# Patient Record
Sex: Male | Born: 1975 | Race: White | Hispanic: No | Marital: Married | Smoking: Former smoker
Health system: Southern US, Community
[De-identification: ages and names within clinical notes are randomized; demographics above are authoritative.]

## PROBLEM LIST (undated history)

## (undated) DIAGNOSIS — M1990 Unspecified osteoarthritis, unspecified site: Secondary | ICD-10-CM

## (undated) DIAGNOSIS — J449 Chronic obstructive pulmonary disease, unspecified: Secondary | ICD-10-CM

## (undated) DIAGNOSIS — F419 Anxiety disorder, unspecified: Secondary | ICD-10-CM

## (undated) DIAGNOSIS — Z87442 Personal history of urinary calculi: Secondary | ICD-10-CM

## (undated) DIAGNOSIS — F329 Major depressive disorder, single episode, unspecified: Secondary | ICD-10-CM

## (undated) DIAGNOSIS — F32A Depression, unspecified: Secondary | ICD-10-CM

## (undated) DIAGNOSIS — H9319 Tinnitus, unspecified ear: Secondary | ICD-10-CM

## (undated) DIAGNOSIS — J189 Pneumonia, unspecified organism: Secondary | ICD-10-CM

## (undated) DIAGNOSIS — H9190 Unspecified hearing loss, unspecified ear: Secondary | ICD-10-CM

## (undated) DIAGNOSIS — Z8709 Personal history of other diseases of the respiratory system: Secondary | ICD-10-CM

## (undated) DIAGNOSIS — I517 Cardiomegaly: Secondary | ICD-10-CM

---

## 2001-04-22 ENCOUNTER — Encounter: Payer: Self-pay | Admitting: Emergency Medicine

## 2001-04-22 ENCOUNTER — Emergency Department (HOSPITAL_COMMUNITY): Admission: EM | Admit: 2001-04-22 | Discharge: 2001-04-22 | Payer: Self-pay | Admitting: Emergency Medicine

## 2002-04-24 ENCOUNTER — Emergency Department (HOSPITAL_COMMUNITY): Admission: EM | Admit: 2002-04-24 | Discharge: 2002-04-24 | Payer: Self-pay | Admitting: Emergency Medicine

## 2008-01-15 ENCOUNTER — Emergency Department (HOSPITAL_COMMUNITY): Admission: EM | Admit: 2008-01-15 | Discharge: 2008-01-15 | Payer: Self-pay | Admitting: Emergency Medicine

## 2008-04-08 ENCOUNTER — Emergency Department (HOSPITAL_COMMUNITY): Admission: EM | Admit: 2008-04-08 | Discharge: 2008-04-08 | Payer: Self-pay | Admitting: Emergency Medicine

## 2008-04-30 ENCOUNTER — Emergency Department (HOSPITAL_COMMUNITY): Admission: EM | Admit: 2008-04-30 | Discharge: 2008-04-30 | Payer: Self-pay | Admitting: Emergency Medicine

## 2008-05-14 ENCOUNTER — Ambulatory Visit (HOSPITAL_COMMUNITY): Admission: RE | Admit: 2008-05-14 | Discharge: 2008-05-14 | Payer: Self-pay | Admitting: Orthopaedic Surgery

## 2008-08-07 ENCOUNTER — Emergency Department (HOSPITAL_COMMUNITY): Admission: EM | Admit: 2008-08-07 | Discharge: 2008-08-07 | Payer: Self-pay | Admitting: Emergency Medicine

## 2009-01-07 ENCOUNTER — Emergency Department (HOSPITAL_COMMUNITY): Admission: EM | Admit: 2009-01-07 | Discharge: 2009-01-08 | Payer: Self-pay | Admitting: Emergency Medicine

## 2009-04-02 ENCOUNTER — Emergency Department (HOSPITAL_COMMUNITY): Admission: EM | Admit: 2009-04-02 | Discharge: 2009-04-02 | Payer: Self-pay | Admitting: Emergency Medicine

## 2009-06-23 ENCOUNTER — Emergency Department (HOSPITAL_COMMUNITY): Admission: EM | Admit: 2009-06-23 | Discharge: 2009-06-23 | Payer: Self-pay | Admitting: Emergency Medicine

## 2009-12-29 ENCOUNTER — Emergency Department (HOSPITAL_COMMUNITY): Admission: EM | Admit: 2009-12-29 | Discharge: 2009-12-29 | Payer: Self-pay | Admitting: Emergency Medicine

## 2010-08-04 ENCOUNTER — Emergency Department (HOSPITAL_COMMUNITY): Admission: EM | Admit: 2010-08-04 | Discharge: 2010-08-04 | Payer: Self-pay | Admitting: Emergency Medicine

## 2011-02-28 LAB — BASIC METABOLIC PANEL
Calcium: 9 mg/dL (ref 8.4–10.5)
Creatinine, Ser: 1.09 mg/dL (ref 0.4–1.5)
GFR calc Af Amer: >60 mL/min (ref >60)
GFR calc non Af Amer: >60 mL/min (ref >60)

## 2011-02-28 LAB — DIFFERENTIAL
Basophils Relative: 0 % (ref 0–1)
Lymphocytes Relative: 6 % — ABNORMAL LOW (ref 12–46)
Lymphs Abs: 0.5 10*3/uL — ABNORMAL LOW (ref 0.7–4.0)
Monocytes Relative: 4 % (ref 3–12)
Neutro Abs: 8.1 10*3/uL — ABNORMAL HIGH (ref 1.7–7.7)
Neutrophils Relative %: 90 % — ABNORMAL HIGH (ref 43–77)

## 2011-02-28 LAB — CBC
RBC: 5.24 MIL/uL (ref 4.22–5.81)
WBC: 8.9 10*3/uL (ref 4.0–10.5)

## 2011-10-15 ENCOUNTER — Emergency Department (HOSPITAL_COMMUNITY)
Admission: EM | Admit: 2011-10-15 | Discharge: 2011-10-16 | Disposition: A | Discharge status: Home / Self Care | Payer: Self-pay | Attending: Emergency Medicine | Admitting: Emergency Medicine

## 2011-10-15 DIAGNOSIS — M545 Low back pain, unspecified: Secondary | ICD-10-CM | POA: Insufficient documentation

## 2011-10-15 DIAGNOSIS — IMO0001 Reserved for inherently not codable concepts without codable children: Secondary | ICD-10-CM | POA: Insufficient documentation

## 2011-10-15 DIAGNOSIS — J438 Other emphysema: Secondary | ICD-10-CM | POA: Insufficient documentation

## 2011-10-15 DIAGNOSIS — M5430 Sciatica, unspecified side: Secondary | ICD-10-CM | POA: Insufficient documentation

## 2012-01-21 DIAGNOSIS — J189 Pneumonia, unspecified organism: Secondary | ICD-10-CM

## 2012-01-21 HISTORY — DX: Pneumonia, unspecified organism: J18.9

## 2012-03-10 ENCOUNTER — Emergency Department (INDEPENDENT_AMBULATORY_CARE_PROVIDER_SITE_OTHER): Payer: Self-pay

## 2012-03-10 ENCOUNTER — Emergency Department (INDEPENDENT_AMBULATORY_CARE_PROVIDER_SITE_OTHER)
Admission: EM | Admit: 2012-03-10 | Discharge: 2012-03-10 | Disposition: A | Discharge status: Home Health | Payer: Self-pay | Source: Home / Self Care | Attending: Emergency Medicine | Admitting: Emergency Medicine

## 2012-03-10 ENCOUNTER — Encounter (HOSPITAL_COMMUNITY): Payer: Self-pay

## 2012-03-10 DIAGNOSIS — S43429A Sprain of unspecified rotator cuff capsule, initial encounter: Secondary | ICD-10-CM

## 2012-03-10 HISTORY — DX: Major depressive disorder, single episode, unspecified: F32.9

## 2012-03-10 HISTORY — DX: Depression, unspecified: F32.A

## 2012-03-10 HISTORY — DX: Chronic obstructive pulmonary disease, unspecified: J44.9

## 2012-03-10 MED ORDER — ACETAMINOPHEN-CODEINE #3 300-30 MG PO TABS: 1.0000 | ORAL_TABLET | ORAL | Status: AC | PRN

## 2012-03-10 MED ORDER — DIPHENOXYLATE-ATROPINE 2.5-0.025 MG PO TABS: 1.0000 | ORAL_TABLET | Freq: Four times a day (QID) | ORAL | Status: DC | PRN

## 2012-03-10 MED ORDER — DICLOFENAC SODIUM 75 MG PO TBEC: 75.0000 mg | DELAYED_RELEASE_TABLET | Freq: Two times a day (BID) | ORAL | Status: DC

## 2012-03-10 NOTE — ED Notes (Signed)
Pt c/o L shoulder pain onset last night around 930pm.  Pt states he was reaching and pulling object that weighs approx 20lbs when he heard a pop.  Pain onset 10-15 mins later.  Pt has not taken any meds PTA for discomfort.  Pt states pain increases with movement.

## 2012-03-10 NOTE — ED Provider Notes (Signed)
Chief Complaint  Patient presents with  . Shoulder Pain    History of Present Illness:   Martin Bridges is a 36 year old male who injured his left shoulder today while at work. He was reaching for a heavy timber on his truck, when it he heard a popping sound in the shoulder, and ever since then he's had pain in the left shoulder on down to the tips of the fingers. It hurts with any movement of his shoulder. He denies any numbness, tingling, or weakness.  Review of Systems:  Other than noted above, the patient denies any of the following symptoms: Systemic:  No fevers, chills, sweats, or aches.  No fatigue or tiredness. Musculoskeletal:  No joint pain, arthritis, bursitis, swelling, back pain, or neck pain. Neurological:  No muscular weakness, paresthesias, headache, or trouble with speech or coordination.  No dizziness.   PMFSH:  Past medical history, family history, social history, meds, and allergies were reviewed.  Physical Exam:   Vital signs:  BP 129/81  Pulse 91  Temp(Src) 97 F (36.1 C) (Oral)  Resp 12  Ht 6\' 3"  (1.905 m)  Wt 340 lb (154.223 kg)  BMI 42.50 kg/m2  SpO2 99% Gen:  Alert and oriented times 3.  In no distress. Musculoskeletal: There was a little pain to palpation over the a.c. joint. No swelling, bruising, or deformity. He has about 60 of abduction actively with pain in the same amount passively. There is no pain with internal or external rotation. He does have pain with flexion, and he can flex to about 60 as well both actively and passively. Otherwise, all joints had a full a ROM with no swelling, bruising or deformity.  No edema, pulses full. Extremities were warm and pink.  Capillary refill was brisk.  Skin:  Clear, warm and dry.  No rash. Neuro:  Alert and oriented times 3.  Muscle strength was normal.  Sensation was intact to light touch.   Radiology:  Dg Shoulder Left  03/10/2012  *RADIOLOGY REPORT*  Clinical Data: Left shoulder injury last night  LEFT SHOULDER -  2+ VIEW  Comparison: None.  Findings: Three views of the left shoulder submitted.  No acute fracture or subluxation.  IMPRESSION: No acute fracture or subluxation.  Original Report Authenticated By: Natasha Mead, M.D.    Assessment:   Diagnoses that have been ruled out:  None  Diagnoses that are still under consideration:  None  Final diagnoses:  Rotator cuff sprain    Plan:   1.  The following meds were prescribed:   New Prescriptions   ACETAMINOPHEN-CODEINE (TYLENOL #3) 300-30 MG PER TABLET    Take 1-2 tablets by mouth every 4 (four) hours as needed for pain.   DICLOFENAC (VOLTAREN) 75 MG EC TABLET    Take 1 tablet (75 mg total) by mouth 2 (two) times daily.   2.  The patient was instructed in symptomatic care, including rest and activity, elevation, application of ice and compression.  Appropriate handouts were given. 3.  The patient was told to return if becoming worse in any way, if no better in 3 or 4 days, and given some red flag symptoms that would indicate earlier return.   4.  The patient was told to follow up with occupational health on Monday. In the meantime he was put in a sling and instructed in pendulum exercises. He should apply ice every 2-3 hours.   Reuben Likes, MD 03/10/12 2132

## 2012-03-10 NOTE — Discharge Instructions (Signed)
Rotator Cuff Injury  The rotator cuff is the collective set of muscles and tendons that make up the stabilizing unit of your shoulder. This unit holds in the ball of the humerus (upper arm bone) in the socket of the scapula (shoulder blade). Injuries to this stabilizing unit most commonly come from sports or activities that cause the arm to be moved repeatedly over the head. Examples of this include throwing, weight lifting, swimming, racquet sports, or an injury such as falling on your arm. Chronic (longstanding) irritation of this unit can cause inflammation (soreness), bursitis, and eventual damage to the tendons to the point of rupture (tear). An acute (sudden) injury of the rotator cuff can result in a partial or complete tear. You may need surgery with complete tears. Small or partial rotator cuff tears may be treated conservatively with temporary immobilization, exercises and rest. Physical therapy may be needed.  HOME CARE INSTRUCTIONS    Apply ice to the injury for 15 to 20 minutes 3 to 4 times per day for the first 2 days. Put the ice in a plastic bag and place a towel between the bag of ice and your skin.   If you have a shoulder immobilizer (sling and straps), do not remove it for as long as directed by your caregiver or until you see a caregiver for a follow-up examination. If you need to remove it, move your arm as little as possible.   You may want to sleep on several pillows or in a recliner at night to lessen swelling and pain.   Only take over-the-counter or prescription medicines for pain, discomfort, or fever as directed by your caregiver.   Do simple hand squeezing exercises with a soft rubber ball to decrease hand swelling.  SEEK MEDICAL CARE IF:    Pain in your shoulder increases or new pain or numbness develops in your arm, hand, or fingers.   Your hand or fingers are colder than your other hand.  SEEK IMMEDIATE MEDICAL CARE IF:    Your arm, hand, or fingers are numb or  tingling.   Your arm, hand, or fingers are increasingly swollen and painful, or turn white or blue.  Document Released: 11/26/2000 Document Revised: 11/18/2011 Document Reviewed: 11/19/2008  [employer] Patient Information 2012 [employer].

## 2012-05-24 ENCOUNTER — Encounter (HOSPITAL_COMMUNITY)
Admission: RE | Admit: 2012-05-24 | Discharge: 2012-05-24 | Disposition: A | Discharge status: Home / Self Care | Payer: BC Managed Care – PPO | Source: Ambulatory Visit | Attending: Orthopedic Surgery | Admitting: Orthopedic Surgery

## 2012-05-24 ENCOUNTER — Encounter (HOSPITAL_COMMUNITY): Payer: Self-pay

## 2012-05-24 ENCOUNTER — Encounter (HOSPITAL_COMMUNITY): Payer: Self-pay | Admitting: Pharmacy Technician

## 2012-05-24 LAB — COMPREHENSIVE METABOLIC PANEL
ALT: 23 U/L (ref 0–53)
AST: 22 U/L (ref 0–37)
Albumin: 3.8 g/dL (ref 3.5–5.2)
Alkaline Phosphatase: 82 U/L (ref 39–117)
BUN: 11 mg/dL (ref 6–23)
CO2: 30 mEq/L (ref 19–32)
Calcium: 9.8 mg/dL (ref 8.4–10.5)
Chloride: 100 mEq/L (ref 96–112)
Creatinine, Ser: 0.99 mg/dL (ref 0.50–1.35)
GFR calc Af Amer: >90 mL/min (ref >90)
GFR calc non Af Amer: >90 mL/min (ref >90)
Glucose, Bld: 136 mg/dL — ABNORMAL HIGH (ref 70–99)
Potassium: 3.4 mEq/L — ABNORMAL LOW (ref 3.5–5.1)
Sodium: 139 mEq/L (ref 135–145)
Total Bilirubin: 0.3 mg/dL (ref 0.3–1.2)
Total Protein: 7.7 g/dL (ref 6.0–8.3)

## 2012-05-24 LAB — DIFFERENTIAL
Basophils Absolute: 0 10*3/uL (ref 0.0–0.1)
Basophils Relative: 0 % (ref 0–1)
Eosinophils Absolute: 0.1 10*3/uL (ref 0.0–0.7)
Eosinophils Relative: 1 % (ref 0–5)
Lymphocytes Relative: 25 % (ref 12–46)
Lymphs Abs: 2.2 10*3/uL (ref 0.7–4.0)
Monocytes Absolute: 0.4 10*3/uL (ref 0.1–1.0)
Monocytes Relative: 4 % (ref 3–12)
Neutro Abs: 6.1 10*3/uL (ref 1.7–7.7)
Neutrophils Relative %: 69 % (ref 43–77)

## 2012-05-24 LAB — CBC
HCT: 44.1 % (ref 39.0–52.0)
Hemoglobin: 14.6 g/dL (ref 13.0–17.0)
MCH: 27.8 pg (ref 26.0–34.0)
MCHC: 33.1 g/dL (ref 30.0–36.0)
MCV: 84 fL (ref 78.0–100.0)
Platelets: 294 10*3/uL (ref 150–400)
RBC: 5.25 MIL/uL (ref 4.22–5.81)
RDW: 13.3 % (ref 11.5–15.5)
WBC: 8.8 10*3/uL (ref 4.0–10.5)

## 2012-05-24 LAB — URINALYSIS, ROUTINE W REFLEX MICROSCOPIC
Bilirubin Urine: NEGATIVE
Glucose, UA: NEGATIVE mg/dL
Hgb urine dipstick: NEGATIVE
Ketones, ur: NEGATIVE mg/dL
Leukocytes, UA: NEGATIVE
Nitrite: NEGATIVE
Protein, ur: NEGATIVE mg/dL
Specific Gravity, Urine: 1.023 (ref 1.005–1.030)
Urobilinogen, UA: 1 mg/dL (ref 0.0–1.0)
pH: 6.5 (ref 5.0–8.0)

## 2012-05-24 LAB — APTT: aPTT: 34 seconds (ref 24–37)

## 2012-05-24 LAB — SURGICAL PCR SCREEN: MRSA, PCR: NEGATIVE

## 2012-05-24 LAB — PROTIME-INR
INR: 0.93 (ref 0.00–1.49)
Prothrombin Time: 12.7 seconds (ref 11.6–15.2)

## 2012-05-24 MED ORDER — CEFAZOLIN SODIUM 1-5 GM-% IV SOLN: 1.0000 g | INTRAVENOUS | Status: DC

## 2012-05-24 NOTE — Progress Notes (Signed)
05/24/12 1111  OBSTRUCTIVE SLEEP APNEA  Have you ever been diagnosed with sleep apnea through a sleep study? No  Do you snore loudly (loud enough to be heard through closed doors)?  1  Do you often feel tired, fatigued, or sleepy during the daytime? 0  Has anyone observed you stop breathing during your sleep? 0  Do you have, or are you being treated for high blood pressure? 0  BMI more than 35 kg/m2? 1  Age over 34 years old? 0  Neck circumference greater than 40 cm/18 inches? 1  Gender: 1  Obstructive Sleep Apnea Score 4   Score 4 or greater  Updated health history;Results sent to PCP

## 2012-05-24 NOTE — Pre-Procedure Instructions (Signed)
barimax bed with trapeze ordered with [facility]. Chest 2 view xray [facility]  05-09-2012 on chart ekg 05-02-2012 [facility] on chart Medical clearance note dr Corrie Dandy phillips on chart

## 2012-05-24 NOTE — Pre-Procedure Instructions (Signed)
stopbang assessment faxed to dr badger [facility], fax confirmation received and placed on pt chart

## 2012-05-24 NOTE — Patient Instructions (Addendum)
22 Martin Bridges  05/24/2012   Your procedure is scheduled on:  05-31-2012  Report to [facility] at 0630  AM.  Call this number if you have problems the morning of surgery: [phone]   Remember:   Do not eat food or drink liquids:After Midnight.  .  Take these medicines the morning of surgery with A SIP OF WATER: albuterol nebulizer ,  Albuterol inhaler if needed and bring inhaler, zoloft.   Do not wear jewelry or make up.  Do not wear lotions, powders, or perfumes.Do not wear deodorant.    Do not bring valuables to the hospital.  Contacts, dentures or bridgework may not be worn into surgery.  Leave suitcase in the car. After surgery it may be brought to your room.  For patients admitted to the hospital, checkout time is 11:00 AM the day of  discharge.     Special Instructions: CHG Shower Use Special Wash: 1/2 bottle night before surgery and 1/2 bottle morning of surgery, use regular soap on face and front and back private area.   Please read over the following fact sheets that you were given: MRSA Information, incentive spirometer fact sheet  [name] pre op nurse phone number [phone], call if needed

## 2012-05-29 NOTE — H&P (Signed)
Martin Bridges DOB: [birth year]  Chief Complaint: left shoulder pain  History of Present Illness The patient is a 36 year old male who is scheduled for an open left rotator cuff repair with Dr. Darrelyn Hillock on Wednesday May 31, 2012. On 03-09-2012 he was lifting a very heavy timber, about 30-40 lbs. 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 Appeared clinically that he tore his rotator cuff. He had no previous injury to that shoulder, no previous problems, and he was actively working with it every day so we have to say that this injury occurred on the job at that time.MRI of his left shoulder showed that he has a definite tear, especially at the junction of the supraspinatus and infraspinatus.    Problem List/Past MedicalHistory Sprain/strain, rotator cuff, traumatic (840.4) Chronic Obstructive Lung Disease Emphysema Of Lung Asthma   Allergies Metal. "all kinds" No Known Drug Allergies.    Family History Congestive Heart Failure. grandmother fathers side Depression. mother Heart Disease. grandmother fathers side Chronic Obstructive Lung Disease. grandmother mothers side and grandfather fathers side Osteoarthritis. grandmother fathers side Rheumatoid Arthritis. grandmother fathers side   Social History Drug/Alcohol Rehab (Previously). no Exercise. Exercises rarely; does other Illicit drug use. no Children. 4 Current work status. working full time Drug/Alcohol Rehab (Currently). no Living situation. live with spouse Tobacco / smoke exposure. yes Tobacco use. former smoker; uses 2 or more can(s) smokeless per week Marital status. married Number of flights of stairs before winded. 4-5 Pain Contract. no Alcohol use. current drinker; drinks beer and hard liquor; only occasionally per week   Past Surgical History No pertinent past surgical  history    Review of Systems General:Present- Fatigue. Not Present- Chills, Fever, Night Sweats, Appetite Loss, Feeling sick, Weight Gain and Weight Loss. Skin:Not Present- Itching, Rash, Skin Color Changes, Ulcer, Psoriasis and Change in Hair or Nails. HEENT:Present- Ringing in the Ears. Not Present- Sensitivity to light, Hearing problems and Nose Bleed. Neck:Not Present- Swollen Glands and Neck Mass. Respiratory:Present- Snoring and Dyspnea. Not Present- Chronic Cough and Bloody sputum. Cardiovascular:Not Present- Shortness of Breath, Chest Pain, Swelling of Extremities, Leg Cramps and Palpitations. Gastrointestinal:Not Present- Bloody Stool, Heartburn, Abdominal Pain, Vomiting, Nausea and Incontinence of Stool. Male Genitourinary:Not Present- Blood in Urine, Frequency, Incontinence and Nocturia. Musculoskeletal:Present- Muscle Weakness, Muscle Pain, Joint Stiffness and Joint Pain. Not Present- Joint Swelling and Back Pain. Neurological:Not Present- Tingling, Numbness, Burning, Tremor, Headaches and Dizziness. Psychiatric:Present- Depression. Not Present- Anxiety and Memory Loss. Endocrine:Not Present- Cold Intolerance, Heat Intolerance, Excessive hunger and Excessive Thirst. Hematology:Not Present- Abnormal Bleeding, Anemia, Blood Clots and Easy Bruising.  Vitals Weight: 357 lb Height: 75 in Body Surface Area: 2.93 m Body Mass Index: 44.62 kg/m Pulse: 95 (Regular) BP: 149/81 (Sitting, Left Arm, Standard)    Physical Exam Physical exam of his shoulder is painful and limited in all ranges. No masses or tumors about the shoulder girdle, per se, or the axilla. AC joint is intact. His shoulder is well located, but he is extremely weak for a strong guy in regards to abduction or any motion of the shoulder. There is quite a bit of discomfort anteriorly as well. Oral cavity is fine except he does have a tongue ring in that he will have to remove prior to  surgery.  Neck: Neck supple, no bruits Lungs:He has some decreased breath sounds. She has a history of asthma and COPD. Heart:Normal  sinus rhythm, no murmurs.      Assessment & Plan Partial rotator cuff rupture, non-traumatic (726.13) Open left shoulder rotator cuff repair    Dimitri Ped, PA-C

## 2012-05-31 ENCOUNTER — Ambulatory Visit (HOSPITAL_COMMUNITY)
Admission: RE | Admit: 2012-05-31 | Discharge: 2012-06-01 | Disposition: A | Discharge status: Home / Self Care | Payer: BC Managed Care – PPO | Source: Ambulatory Visit | Attending: Orthopedic Surgery | Admitting: Orthopedic Surgery

## 2012-05-31 ENCOUNTER — Encounter (HOSPITAL_COMMUNITY): Payer: Self-pay | Admitting: *Deleted

## 2012-05-31 ENCOUNTER — Ambulatory Visit (HOSPITAL_COMMUNITY): Payer: BC Managed Care – PPO | Admitting: Anesthesiology

## 2012-05-31 ENCOUNTER — Encounter (HOSPITAL_COMMUNITY)
Admission: RE | Disposition: A | Discharge status: Home / Self Care | Payer: Self-pay | Source: Ambulatory Visit | Attending: Orthopedic Surgery

## 2012-05-31 ENCOUNTER — Encounter (HOSPITAL_COMMUNITY): Payer: Self-pay | Admitting: Anesthesiology

## 2012-05-31 DIAGNOSIS — X500XXA Overexertion from strenuous movement or load, initial encounter: Secondary | ICD-10-CM | POA: Insufficient documentation

## 2012-05-31 DIAGNOSIS — M25819 Other specified joint disorders, unspecified shoulder: Secondary | ICD-10-CM | POA: Insufficient documentation

## 2012-05-31 DIAGNOSIS — M75100 Unspecified rotator cuff tear or rupture of unspecified shoulder, not specified as traumatic: Secondary | ICD-10-CM

## 2012-05-31 DIAGNOSIS — S43429A Sprain of unspecified rotator cuff capsule, initial encounter: Secondary | ICD-10-CM | POA: Insufficient documentation

## 2012-05-31 DIAGNOSIS — Y929 Unspecified place or not applicable: Secondary | ICD-10-CM | POA: Insufficient documentation

## 2012-05-31 DIAGNOSIS — J4489 Other specified chronic obstructive pulmonary disease: Secondary | ICD-10-CM | POA: Insufficient documentation

## 2012-05-31 DIAGNOSIS — Z01812 Encounter for preprocedural laboratory examination: Secondary | ICD-10-CM | POA: Insufficient documentation

## 2012-05-31 DIAGNOSIS — J449 Chronic obstructive pulmonary disease, unspecified: Secondary | ICD-10-CM | POA: Insufficient documentation

## 2012-05-31 DIAGNOSIS — M12819 Other specific arthropathies, not elsewhere classified, unspecified shoulder: Secondary | ICD-10-CM | POA: Diagnosis present

## 2012-05-31 DIAGNOSIS — Y9389 Activity, other specified: Secondary | ICD-10-CM | POA: Insufficient documentation

## 2012-05-31 HISTORY — PX: SHOULDER OPEN ROTATOR CUFF REPAIR: SHX2407

## 2012-05-31 SURGERY — REPAIR, ROTATOR CUFF, OPEN
Anesthesia: General | Site: Shoulder | Laterality: Left | Wound class: Clean

## 2012-05-31 MED ORDER — LACTATED RINGERS IV SOLN
INTRAVENOUS | Status: DC | PRN
  Administered 2012-05-31: 08:00:00 via INTRAVENOUS

## 2012-05-31 MED ORDER — ONDANSETRON HCL 4 MG/2ML IJ SOLN: 4.0000 mg | Freq: Four times a day (QID) | INTRAMUSCULAR | Status: DC | PRN

## 2012-05-31 MED ORDER — FENTANYL CITRATE 0.05 MG/ML IJ SOLN
INTRAMUSCULAR | Status: DC | PRN
  Administered 2012-05-31 (×3): 50 ug via INTRAVENOUS
  Administered 2012-05-31: 100 ug via INTRAVENOUS

## 2012-05-31 MED ORDER — ACETAMINOPHEN 10 MG/ML IV SOLN
INTRAVENOUS | Status: AC
  Filled 2012-05-31: qty 100

## 2012-05-31 MED ORDER — METOCLOPRAMIDE HCL 5 MG/ML IJ SOLN: 5.0000 mg | Freq: Three times a day (TID) | INTRAMUSCULAR | Status: DC | PRN

## 2012-05-31 MED ORDER — OXYCODONE-ACETAMINOPHEN 5-325 MG PO TABS
1.0000 | ORAL_TABLET | ORAL | Status: DC | PRN
  Administered 2012-05-31 – 2012-06-01 (×3): 2 via ORAL
  Filled 2012-05-31 (×3): qty 2

## 2012-05-31 MED ORDER — BISACODYL 10 MG RE SUPP: 10.0000 mg | Freq: Every day | RECTAL | Status: DC | PRN

## 2012-05-31 MED ORDER — SUCCINYLCHOLINE CHLORIDE 20 MG/ML IJ SOLN
INTRAMUSCULAR | Status: DC | PRN
  Administered 2012-05-31: 100 mg via INTRAVENOUS

## 2012-05-31 MED ORDER — ACETAMINOPHEN 650 MG RE SUPP: 650.0000 mg | Freq: Four times a day (QID) | RECTAL | Status: DC | PRN

## 2012-05-31 MED ORDER — 0.9 % SODIUM CHLORIDE (POUR BTL) OPTIME
TOPICAL | Status: DC | PRN
  Administered 2012-05-31: 1000 mL

## 2012-05-31 MED ORDER — ALBUTEROL SULFATE (5 MG/ML) 0.5% IN NEBU: 2.5000 mg | INHALATION_SOLUTION | RESPIRATORY_TRACT | Status: DC | PRN

## 2012-05-31 MED ORDER — BUPIVACAINE LIPOSOME 1.3 % IJ SUSP
20.0000 mL | INTRAMUSCULAR | Status: AC
  Administered 2012-05-31: 20 mL
  Filled 2012-05-31: qty 20

## 2012-05-31 MED ORDER — MIDAZOLAM HCL 2 MG/2ML IJ SOLN
INTRAMUSCULAR | Status: DC | PRN
  Administered 2012-05-31: 2 mg via INTRAVENOUS

## 2012-05-31 MED ORDER — LIDOCAINE HCL (CARDIAC) 20 MG/ML IV SOLN
INTRAVENOUS | Status: DC | PRN
  Administered 2012-05-31: 80 mg via INTRAVENOUS

## 2012-05-31 MED ORDER — HYDROMORPHONE HCL PF 1 MG/ML IJ SOLN
INTRAMUSCULAR | Status: AC
  Filled 2012-05-31: qty 1

## 2012-05-31 MED ORDER — CEFAZOLIN SODIUM-DEXTROSE 2-3 GM-% IV SOLR
2.0000 g | Freq: Once | INTRAVENOUS | Status: AC
  Administered 2012-05-31: 2 g via INTRAVENOUS

## 2012-05-31 MED ORDER — POLYETHYLENE GLYCOL 3350 17 G PO PACK: 17.0000 g | PACK | Freq: Every day | ORAL | Status: DC | PRN

## 2012-05-31 MED ORDER — METHOCARBAMOL 500 MG PO TABS
500.0000 mg | ORAL_TABLET | Freq: Four times a day (QID) | ORAL | Status: DC | PRN
  Administered 2012-05-31 – 2012-06-01 (×2): 500 mg via ORAL
  Filled 2012-05-31 (×2): qty 1

## 2012-05-31 MED ORDER — THROMBIN 5000 UNITS EX SOLR
CUTANEOUS | Status: AC
  Filled 2012-05-31: qty 5000

## 2012-05-31 MED ORDER — MENTHOL 3 MG MT LOZG: 1.0000 | LOZENGE | OROMUCOSAL | Status: DC | PRN

## 2012-05-31 MED ORDER — LACTATED RINGERS IV SOLN
INTRAVENOUS | Status: DC
  Administered 2012-05-31 (×2): via INTRAVENOUS

## 2012-05-31 MED ORDER — SERTRALINE HCL 100 MG PO TABS
100.0000 mg | ORAL_TABLET | Freq: Every morning | ORAL | Status: DC
  Administered 2012-05-31 – 2012-06-01 (×2): 100 mg via ORAL
  Filled 2012-05-31 (×2): qty 1

## 2012-05-31 MED ORDER — ALBUTEROL SULFATE HFA 108 (90 BASE) MCG/ACT IN AERS
INHALATION_SPRAY | RESPIRATORY_TRACT | Status: AC
  Filled 2012-05-31: qty 6.7

## 2012-05-31 MED ORDER — HYDROCODONE-ACETAMINOPHEN 5-325 MG PO TABS
1.0000 | ORAL_TABLET | ORAL | Status: DC | PRN
  Administered 2012-06-01: 2 via ORAL
  Filled 2012-05-31: qty 2

## 2012-05-31 MED ORDER — ACETAMINOPHEN 10 MG/ML IV SOLN
INTRAVENOUS | Status: DC | PRN
  Administered 2012-05-31: 1000 mg via INTRAVENOUS

## 2012-05-31 MED ORDER — HYDROMORPHONE HCL PF 1 MG/ML IJ SOLN
0.2500 mg | INTRAMUSCULAR | Status: DC | PRN
  Administered 2012-05-31 (×4): 0.5 mg via INTRAVENOUS

## 2012-05-31 MED ORDER — ONDANSETRON HCL 4 MG PO TABS: 4.0000 mg | ORAL_TABLET | Freq: Four times a day (QID) | ORAL | Status: DC | PRN

## 2012-05-31 MED ORDER — ROCURONIUM BROMIDE 100 MG/10ML IV SOLN
INTRAVENOUS | Status: DC | PRN
  Administered 2012-05-31: 30 mg via INTRAVENOUS
  Administered 2012-05-31: 20 mg via INTRAVENOUS

## 2012-05-31 MED ORDER — METHOCARBAMOL 100 MG/ML IJ SOLN
500.0000 mg | Freq: Four times a day (QID) | INTRAVENOUS | Status: DC | PRN
  Administered 2012-05-31: 500 mg via INTRAVENOUS
  Filled 2012-05-31 (×2): qty 5

## 2012-05-31 MED ORDER — ACETAMINOPHEN 325 MG PO TABS: 650.0000 mg | ORAL_TABLET | Freq: Four times a day (QID) | ORAL | Status: DC | PRN

## 2012-05-31 MED ORDER — CEFAZOLIN SODIUM 1-5 GM-% IV SOLN
1.0000 g | Freq: Four times a day (QID) | INTRAVENOUS | Status: AC
  Administered 2012-05-31 – 2012-06-01 (×3): 1 g via INTRAVENOUS
  Filled 2012-05-31 (×4): qty 50

## 2012-05-31 MED ORDER — LACTATED RINGERS IV SOLN: INTRAVENOUS | Status: DC

## 2012-05-31 MED ORDER — SODIUM CHLORIDE 0.9 % IR SOLN
Status: DC | PRN
  Administered 2012-05-31: 09:00:00

## 2012-05-31 MED ORDER — FLEET ENEMA 7-19 GM/118ML RE ENEM: 1.0000 | ENEMA | Freq: Once | RECTAL | Status: AC | PRN

## 2012-05-31 MED ORDER — ONDANSETRON HCL 4 MG/2ML IJ SOLN
INTRAMUSCULAR | Status: DC | PRN
  Administered 2012-05-31: 4 mg via INTRAVENOUS

## 2012-05-31 MED ORDER — CEFAZOLIN SODIUM-DEXTROSE 2-3 GM-% IV SOLR
INTRAVENOUS | Status: AC
  Filled 2012-05-31: qty 50

## 2012-05-31 MED ORDER — DEXAMETHASONE SODIUM PHOSPHATE 10 MG/ML IJ SOLN
INTRAMUSCULAR | Status: DC | PRN
  Administered 2012-05-31: 10 mg via INTRAVENOUS

## 2012-05-31 MED ORDER — PHENOL 1.4 % MT LIQD: 1.0000 | OROMUCOSAL | Status: DC | PRN

## 2012-05-31 MED ORDER — HYDROMORPHONE HCL PF 1 MG/ML IJ SOLN
0.5000 mg | INTRAMUSCULAR | Status: DC | PRN
  Administered 2012-05-31 (×2): 1 mg via INTRAVENOUS
  Filled 2012-05-31: qty 1

## 2012-05-31 MED ORDER — PROPOFOL 10 MG/ML IV EMUL
INTRAVENOUS | Status: DC | PRN
  Administered 2012-05-31: 200 mg via INTRAVENOUS

## 2012-05-31 MED ORDER — ALBUTEROL SULFATE HFA 108 (90 BASE) MCG/ACT IN AERS
2.0000 | INHALATION_SPRAY | Freq: Four times a day (QID) | RESPIRATORY_TRACT | Status: DC | PRN
  Filled 2012-05-31: qty 6.7

## 2012-05-31 MED ORDER — METOCLOPRAMIDE HCL 10 MG PO TABS: 5.0000 mg | ORAL_TABLET | Freq: Three times a day (TID) | ORAL | Status: DC | PRN

## 2012-05-31 MED ORDER — BACITRACIN-NEOMYCIN-POLYMYXIN 400-5-5000 EX OINT
TOPICAL_OINTMENT | CUTANEOUS | Status: AC
  Filled 2012-05-31: qty 1

## 2012-05-31 SURGICAL SUPPLY — 51 items
BAG SPEC THK2 15X12 ZIP CLS (MISCELLANEOUS) ×1
BAG ZIPLOCK 12X15 (MISCELLANEOUS) ×2 IMPLANT
BLADE OSCILLATING/SAGITTAL (BLADE) ×2
BLADE SW THK.38XMED LNG THN (BLADE) ×1 IMPLANT
BNDG COHESIVE 6X5 TAN NS LF (GAUZE/BANDAGES/DRESSINGS) IMPLANT
BUR OVAL CARBIDE 4.0 (BURR) ×2 IMPLANT
CLEANER TIP ELECTROSURG 2X2 (MISCELLANEOUS) ×2 IMPLANT
CLOTH BEACON ORANGE TIMEOUT ST (SAFETY) ×2 IMPLANT
DRAPE INCISE IOBAN 66X45 STRL (DRAPES) ×1 IMPLANT
DRAPE POUCH INSTRU U-SHP 10X18 (DRAPES) ×2 IMPLANT
DRSG EMULSION OIL 3X3 NADH (GAUZE/BANDAGES/DRESSINGS) ×2 IMPLANT
DRSG PAD ABDOMINAL 8X10 ST (GAUZE/BANDAGES/DRESSINGS) ×3 IMPLANT
DURAPREP 26ML APPLICATOR (WOUND CARE) ×2 IMPLANT
ELECT REM PT RETURN 9FT ADLT (ELECTROSURGICAL) ×2
ELECTRODE REM PT RTRN 9FT ADLT (ELECTROSURGICAL) ×1 IMPLANT
FLOSEAL 10ML (HEMOSTASIS) IMPLANT
GLOVE BIOGEL PI IND STRL 8 (GLOVE) ×1 IMPLANT
GLOVE BIOGEL PI IND STRL 8.5 (GLOVE) ×1 IMPLANT
GLOVE BIOGEL PI INDICATOR 8 (GLOVE) ×1
GLOVE BIOGEL PI INDICATOR 8.5 (GLOVE) ×1
GLOVE ECLIPSE 8.0 STRL XLNG CF (GLOVE) ×4 IMPLANT
GLOVE SURG SS PI 6.5 STRL IVOR (GLOVE) ×4 IMPLANT
GOWN PREVENTION PLUS LG XLONG (DISPOSABLE) ×4 IMPLANT
GOWN STRL REIN XL XLG (GOWN DISPOSABLE) ×4 IMPLANT
KIT BASIN OR (CUSTOM PROCEDURE TRAY) ×2 IMPLANT
MANIFOLD NEPTUNE II (INSTRUMENTS) ×2 IMPLANT
NDL MA TROC 1/2 (NEEDLE) IMPLANT
NEEDLE MA TROC 1/2 (NEEDLE) IMPLANT
NS IRRIG 1000ML POUR BTL (IV SOLUTION) IMPLANT
PACK SHOULDER CUSTOM OPM052 (CUSTOM PROCEDURE TRAY) ×2 IMPLANT
PASSER SUT SWANSON 36MM LOOP (INSTRUMENTS) IMPLANT
PATCH TISSUE MEND 3X3CM (Orthopedic Implant) ×1 IMPLANT
POSITIONER SURGICAL ARM (MISCELLANEOUS) ×2 IMPLANT
SLING ARM IMMOBILIZER LRG (SOFTGOODS) ×2 IMPLANT
SPONGE GAUZE 4X4 12PLY (GAUZE/BANDAGES/DRESSINGS) ×1 IMPLANT
SPONGE SURGIFOAM ABS GEL 100 (HEMOSTASIS) IMPLANT
STAPLER VISISTAT 35W (STAPLE) ×2 IMPLANT
STRIP CLOSURE SKIN 1/2X4 (GAUZE/BANDAGES/DRESSINGS) ×3 IMPLANT
SUCTION FRAZIER 12FR DISP (SUCTIONS) ×2 IMPLANT
SUT BONE WAX W31G (SUTURE) ×2 IMPLANT
SUT ETHIBOND NAB CT1 #1 30IN (SUTURE) IMPLANT
SUT MNCRL AB 4-0 PS2 18 (SUTURE) ×2 IMPLANT
SUT VIC AB 0 CT1 27 (SUTURE) ×2
SUT VIC AB 0 CT1 27XBRD ANTBC (SUTURE) ×1 IMPLANT
SUT VIC AB 1 CT1 27 (SUTURE) ×6
SUT VIC AB 1 CT1 27XBRD ANTBC (SUTURE) ×2 IMPLANT
SUT VIC AB 2-0 CT1 27 (SUTURE) ×6
SUT VIC AB 2-0 CT1 27XBRD (SUTURE) IMPLANT
SUT VIC AB 2-0 CT1 TAPERPNT 27 (SUTURE) IMPLANT
TAPE CLOTH SURG 6X10 WHT LF (GAUZE/BANDAGES/DRESSINGS) ×1 IMPLANT
TOWEL OR 17X26 10 PK STRL BLUE (TOWEL DISPOSABLE) ×4 IMPLANT

## 2012-05-31 NOTE — Interval H&P Note (Signed)
History and Physical Interval Note:  05/31/2012 8:12 AM  Martin Bridges  has presented today for surgery, with the diagnosis of left shoulder rotator cuff tear  The various methods of treatment have been discussed with the patient and family. After consideration of risks, benefits and other options for treatment, the patient has consented to  Procedure(s) (LRB): ROTATOR CUFF REPAIR SHOULDER OPEN (Left) as a surgical intervention .  The patient's history has been reviewed, patient examined, no change in status, stable for surgery.  I have reviewed the patients' chart and labs.  Questions were answered to the patient's satisfaction.     Iris Tatsch A

## 2012-05-31 NOTE — Anesthesia Preprocedure Evaluation (Signed)
Anesthesia Evaluation  Patient identified by MRN, date of birth, ID band Patient awake    Reviewed: Allergy & Precautions, H&P , NPO status , Patient's Chart, lab work & pertinent test results, reviewed documented beta blocker date and time   Airway Mallampati: II TM Distance: >3 FB   Mouth opening: Limited Mouth Opening  Dental  (+) Teeth Intact, Poor Dentition and Dental Advisory Given   Pulmonary asthma , former smoker Given clearance breath sounds clear to auscultation        Cardiovascular negative cardio ROS  Rhythm:Regular Rate:Normal  Denies cardiac symptoms   Neuro/Psych negative neurological ROS  negative psych ROS   GI/Hepatic negative GI ROS, Neg liver ROS,   Endo/Other  negative endocrine ROSMorbid obesity  Renal/GU negative Renal ROS  negative genitourinary   Musculoskeletal   Abdominal   Peds negative pediatric ROS (+)  Hematology negative hematology ROS (+)   Anesthesia Other Findings   Reproductive/Obstetrics negative OB ROS                           Anesthesia Physical Anesthesia Plan  ASA: III  Anesthesia Plan: General   Post-op Pain Management:    Induction: Intravenous  Airway Management Planned: Oral ETT  Additional Equipment:   Intra-op Plan:   Post-operative Plan: Extubation in OR  Informed Consent: I have reviewed the patients History and Physical, chart, labs and discussed the procedure including the risks, benefits and alternatives for the proposed anesthesia with the patient or authorized representative who has indicated his/her understanding and acceptance.   Dental advisory given  Plan Discussed with: CRNA and Surgeon  Anesthesia Plan Comments:         Anesthesia Quick Evaluation

## 2012-05-31 NOTE — Brief Op Note (Signed)
05/31/2012  10:01 AM  PATIENT:  Randall An  36 y.o. male  PRE-OPERATIVE DIAGNOSIS:  left shoulder rotator cuff tear and severe Impingement   POST-OPERATIVE DIAGNOSIS:  left shoulder rotator cuff tear and severe Impingement  PROCEDURE:  Procedure(s) (LRB): ROTATOR CUFF REPAIR SHOULDER OPEN (Left) and Tissue Mend Graft and Acromionectomy.  SURGEON:  Surgeon(s) and Role:    * Jacki Cones, MD - Primary  PHYSICIAN ASSISTANT:Amber Celedonio Savage PA      ANESTHESIA:   general  EBL:     BLOOD ADMINISTERED:none  DRAINS: none   LOCAL MEDICATIONS USED:  BUPIVICAINE 20cc.  SPECIMEN:  No Specimen  DISPOSITION OF SPECIMEN:  N/A  COUNTS:  YES  TOURNIQUET:  * No tourniquets in log *  DICTATION: .Other Dictation: Dictation Number [id number]  PLAN OF CARE: Admit for overnight observation  PATIENT DISPOSITION:  PACU - hemodynamically stable.   Delay start of Pharmacological VTE agent (>24hrs) due to surgical blood loss or risk of bleeding: yes

## 2012-05-31 NOTE — Transfer of Care (Signed)
Immediate Anesthesia Transfer of Care Note  Patient: Martin Bridges  Procedure(s) Performed: Procedure(s) (LRB): ROTATOR CUFF REPAIR SHOULDER OPEN (Left)  Patient Location: PACU  Anesthesia Type: General  Level of Consciousness: awake and alert   Airway & Oxygen Therapy: Patient Spontanous Breathing and Patient connected to face mask oxygen  Post-op Assessment: Report given to PACU RN and Post -op Vital signs reviewed and stable  Post vital signs: Reviewed and stable  Complications: No apparent anesthesia complications

## 2012-05-31 NOTE — Anesthesia Postprocedure Evaluation (Signed)
  Anesthesia Post-op Note  Patient: Martin Bridges  Procedure(s) Performed: Procedure(s) (LRB): ROTATOR CUFF REPAIR SHOULDER OPEN (Left)  Patient Location: PACU  Anesthesia Type: General  Level of Consciousness: oriented and sedated  Airway and Oxygen Therapy: Patient Spontanous Breathing and Patient connected to nasal cannula oxygen  Post-op Pain: mild  Post-op Assessment: Post-op Vital signs reviewed, Patient's Cardiovascular Status Stable, Respiratory Function Stable and Patent Airway  Post-op Vital Signs: stable  Complications: No apparent anesthesia complications

## 2012-06-01 MED ORDER — OXYCODONE-ACETAMINOPHEN 5-325 MG PO TABS: 1.0000 | ORAL_TABLET | ORAL | Status: AC | PRN

## 2012-06-01 MED ORDER — METHOCARBAMOL 500 MG PO TABS: 500.0000 mg | ORAL_TABLET | Freq: Four times a day (QID) | ORAL | Status: AC | PRN

## 2012-06-01 NOTE — Discharge Summary (Signed)
Physician Discharge Summary   Patient ID: Martin Bridges MRN: [id number] DOB/AGE: [birth year] 36 y.o.  Admit date: 05/31/2012 Discharge date: 06/01/2012  Primary Diagnosis:  Rotator cuff tear, left shoulder  Admission Diagnoses:  Past Medical History  Diagnosis Date  . COPD (chronic obstructive pulmonary disease)   . Asthma   . Depression   . Emphysema   . GERD (gastroesophageal reflux disease)   . Sleep apnea     stopbang=4   Discharge Diagnoses:   Active Problems:  Rotator cuff tear arthropathy S/P open left rotator cuff repair Procedure:  Procedure(s) (LRB): ROTATOR CUFF REPAIR SHOULDER OPEN (Left)   Consults: None  HPI: The patient is a 36 year old male who  was lifting a very heavy timber, about 30-40 lbs. 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 Appeared clinically that he tore his rotator cuff. He had no previous injury to that shoulder, no previous problems, and he was actively working with it every day so we have to say that this injury occurred on the job at that time. MRI of his left shoulder showed that he has a definite tear, especially at the junction of the supraspinatus and infraspinatus.       Laboratory Data: Hospital Outpatient Visit on 05/24/2012  Component Date Value Range Status  . MRSA, PCR 05/24/2012 NEGATIVE  NEGATIVE Final  . Staphylococcus aureus 05/24/2012 NEGATIVE  NEGATIVE Final   Comment:                                 The Xpert SA Assay (FDA                          approved for NASAL specimens                          only), is one component of                          a comprehensive surveillance                          program.  It is not intended                          to diagnose infection nor to                          guide or monitor treatment.  Marland Kitchen aPTT 05/24/2012 34  24 - 37 seconds Final  . WBC 05/24/2012 8.8  4.0 - 10.5 K/uL  Final  . RBC 05/24/2012 5.25  4.22 - 5.81 MIL/uL Final  . Hemoglobin 05/24/2012 14.6  13.0 - 17.0 g/dL Final  . HCT 24/40/1027 44.1  39.0 - 52.0 % Final  . MCV 05/24/2012 84.0  78.0 - 100.0 fL Final  . MCH 05/24/2012 27.8  26.0 - 34.0 pg Final  . MCHC 05/24/2012 33.1  30.0 - 36.0 g/dL Final  . RDW 25/36/6440 13.3  11.5 - 15.5 % Final  . Platelets 05/24/2012 294  150 - 400 K/uL Final  . Sodium 05/24/2012 139  135 - 145 mEq/L Final  .  Potassium 05/24/2012 3.4* 3.5 - 5.1 mEq/L Final  . Chloride 05/24/2012 100  96 - 112 mEq/L Final  . CO2 05/24/2012 30  19 - 32 mEq/L Final  . Glucose, Bld 05/24/2012 136* 70 - 99 mg/dL Final  . BUN 16/09/9603 11  6 - 23 mg/dL Final  . Creatinine, Ser 05/24/2012 0.99  0.50 - 1.35 mg/dL Final  . Calcium 54/08/8118 9.8  8.4 - 10.5 mg/dL Final  . Total Protein 05/24/2012 7.7  6.0 - 8.3 g/dL Final  . Albumin 14/78/2956 3.8  3.5 - 5.2 g/dL Final  . AST 21/30/8657 22  0 - 37 U/L Final  . ALT 05/24/2012 23  0 - 53 U/L Final  . Alkaline Phosphatase 05/24/2012 82  39 - 117 U/L Final  . Total Bilirubin 05/24/2012 0.3  0.3 - 1.2 mg/dL Final  . GFR calc non Af Amer 05/24/2012 >90  >90 mL/min Final  . GFR calc Af Amer 05/24/2012 >90  >90 mL/min Final   Comment:                                 The eGFR has been calculated                          using the CKD EPI equation.                          This calculation has not been                          validated in all clinical                          situations.                          eGFR's persistently                          <90 mL/min signify                          possible Chronic Kidney Disease.  Marland Kitchen Neutrophils Relative 05/24/2012 69  43 - 77 % Final  . Neutro Abs 05/24/2012 6.1  1.7 - 7.7 K/uL Final  . Lymphocytes Relative 05/24/2012 25  12 - 46 % Final  . Lymphs Abs 05/24/2012 2.2  0.7 - 4.0 K/uL Final  . Monocytes Relative 05/24/2012 4  3 - 12 % Final  . Monocytes Absolute 05/24/2012 0.4  0.1 - 1.0  K/uL Final  . Eosinophils Relative 05/24/2012 1  0 - 5 % Final  . Eosinophils Absolute 05/24/2012 0.1  0.0 - 0.7 K/uL Final  . Basophils Relative 05/24/2012 0  0 - 1 % Final  . Basophils Absolute 05/24/2012 0.0  0.0 - 0.1 K/uL Final  . Prothrombin Time 05/24/2012 12.7  11.6 - 15.2 seconds Final  . INR 05/24/2012 0.93  0.00 - 1.49 Final  . Color, Urine 05/24/2012 YELLOW  YELLOW Final  . APPearance 05/24/2012 CLEAR  CLEAR Final  . Specific Gravity, Urine 05/24/2012 1.023  1.005 - 1.030 Final  . pH 05/24/2012 6.5  5.0 - 8.0 Final  . Glucose, UA 05/24/2012 NEGATIVE  NEGATIVE mg/dL Final  .  Hgb urine dipstick 05/24/2012 NEGATIVE  NEGATIVE Final  . Bilirubin Urine 05/24/2012 NEGATIVE  NEGATIVE Final  . Ketones, ur 05/24/2012 NEGATIVE  NEGATIVE mg/dL Final  . Protein, ur 40/98/1191 NEGATIVE  NEGATIVE mg/dL Final  . Urobilinogen, UA 05/24/2012 1.0  0.0 - 1.0 mg/dL Final  . Nitrite 47/82/9562 NEGATIVE  NEGATIVE Final  . Leukocytes, UA 05/24/2012 NEGATIVE  NEGATIVE Final   MICROSCOPIC NOT DONE ON URINES WITH NEGATIVE PROTEIN, BLOOD, LEUKOCYTES, NITRITE, OR GLUCOSE <1000 mg/dL.     Hospital Course: Patient was admitted to [facility] and taken to the OR and underwent the above state procedure without complications.  Patient tolerated the procedure well and was later transferred to the recovery room and then to the orthopaedic floor for postoperative care.  They were given PO and IV analgesics for pain control following their surgery.  They were given 24 hours of postoperative antibiotics and started on DVT prophylaxis in the form of Aspirin.  OT was ordered for sling care and management.  Patient had a decent night on the evening of surgery. No problems during observation overnight.Patient was seen in rounds and was ready to go home after meeting with OT. Discharge instructions dicussed with patient.   Discharge Medications: Prior to Admission medications   Medication Sig Start Date End Date  Taking? Authorizing Provider  sertraline (ZOLOFT) 100 MG tablet Take 100 mg by mouth every morning.    Yes Historical Provider, MD  albuterol (PROVENTIL HFA;VENTOLIN HFA) 108 (90 BASE) MCG/ACT inhaler Inhale 2 puffs into the lungs every 6 (six) hours as needed.    Historical Provider, MD  albuterol (PROVENTIL) (2.5 MG/3ML) 0.083% nebulizer solution Take 2.5 mg by nebulization every 6 (six) hours as needed.    Historical Provider, MD  methocarbamol (ROBAXIN) 500 MG tablet Take 1 tablet (500 mg total) by mouth every 6 (six) hours as needed. 06/01/12 06/11/12  Kimmy Totten Tamala Ser, PA  oxyCODONE-acetaminophen (PERCOCET) 5-325 MG per tablet Take 1-2 tablets by mouth every 4 (four) hours as needed. 06/01/12 06/11/12  Kendria Halberg Tamala Ser, PA    Diet: Regular diet Activity:Wear sling on left arm at all times Follow-up:in 2 weeks Disposition - Home Discharged Condition: good   Discharge Orders    Future Orders Please Complete By Expires   Diet - low sodium heart healthy      Call MD / Call 911      Comments:   If you experience chest pain or shortness of breath, CALL 911 and be transported to the hospital emergency room.  If you develope a fever above 101 F, pus (white drainage) or increased drainage or redness at the wound, or calf pain, call your surgeon's office.   Constipation Prevention      Comments:   Drink plenty of fluids.  Prune juice may be helpful.  You may use a stool softener, such as Colace (over the counter) 100 mg twice a day.  Use MiraLax (over the counter) for constipation as needed.   Increase activity slowly as tolerated      Comments:   Wear sling on left shoulder at all times   Discharge instructions      Comments:   Keep your sling on at all times, including sleeping in your sling.  The only time you should remove your sling is to shower only but you need to keep your hand against your chest while you shower.   For the first few days, remove your dressing, tape a  piece of  saran wrap over your incision, take your shower, then remove the saran wrap and put a clean dressing on, then reapply your sling.  After two days you can shower without the saran wrap.   Call Dr. Darrelyn Hillock if any wound complications or temperature of 101 degrees F or over.   Call the office for an appointment to see Dr. Darrelyn Hillock in two weeks: [phone] and ask for Dr. Jeannetta Ellis nurse, Mackey Birchwood.   Driving restrictions      Comments:   No driving   Lifting restrictions      Comments:   No lifting     Medication List  As of 06/01/2012  1:51 PM   TAKE these medications         albuterol 108 (90 BASE) MCG/ACT inhaler   Commonly known as: PROVENTIL HFA;VENTOLIN HFA   Inhale 2 puffs into the lungs every 6 (six) hours as needed.      albuterol (2.5 MG/3ML) 0.083% nebulizer solution   Commonly known as: PROVENTIL   Take 2.5 mg by nebulization every 6 (six) hours as needed.      methocarbamol 500 MG tablet   Commonly known as: ROBAXIN   Take 1 tablet (500 mg total) by mouth every 6 (six) hours as needed.      oxyCODONE-acetaminophen 5-325 MG per tablet   Commonly known as: PERCOCET   Take 1-2 tablets by mouth every 4 (four) hours as needed.      sertraline 100 MG tablet   Commonly known as: ZOLOFT   Take 100 mg by mouth every morning.             Signed: Elka Satterfield LAUREN 06/01/2012, 1:51 PM

## 2012-06-01 NOTE — Progress Notes (Signed)
Subjective: 1 Day Post-Op Procedure(s) (LRB): ROTATOR CUFF REPAIR SHOULDER OPEN (Left) Patient reports pain as mild.   Patient seen in rounds without Dr. Darrelyn Hillock. Patient is well, and has had no acute complaints or problems. He reports that he did not sleep great last night because he found it difficult to get comfortable with due to the shoulder. He denies chest pain and shortness of breath. He reports that he does have some pain but overall he is doing well. He is ready to go home. Plan is to go Home after hospital stay.  Objective: Vital signs in last 24 hours: Temp:  [97.6 F (36.4 C)-98.3 F (36.8 C)] 98.2 F (36.8 C) (06/20 0550) Pulse Rate:  [51-112] 95  (06/20 0550) Resp:  [14-24] 18  (06/20 0550) BP: (95-147)/(64-94) 112/70 mmHg (06/20 0550) SpO2:  [95 %-100 %] 97 % (06/20 0550) Weight:  [161.571 kg (356 lb 3.2 oz)] 161.571 kg (356 lb 3.2 oz) (06/19 1518)  Intake/Output from previous day:  Intake/Output Summary (Last 24 hours) at 06/01/12 0756 Last data filed at 06/01/12 0700  Gross per 24 hour  Intake 3134.01 ml  Output   1000 ml  Net 2134.01 ml     EXAM General - Patient is Alert and Oriented Extremity - Neurologically intact Neurovascular intact Grip strength 5/5 Dressing - dressing C/D/I   Past Medical History  Diagnosis Date  . COPD (chronic obstructive pulmonary disease)   . Asthma   . Depression   . Emphysema   . GERD (gastroesophageal reflux disease)   . Sleep apnea     stopbang=4    Assessment/Plan: 1 Day Post-Op Procedure(s) (LRB): ROTATOR CUFF REPAIR SHOULDER OPEN (Left) Active Problems:  Rotator cuff tear arthropathy   D/C IV fluids Discharge home  DVT Prophylaxis - Aspirin Wear sling on left arm at all times D/C O2 and Pulse OX and try on Room Air  Deontre Allsup LAUREN 06/01/2012, 7:56 AM

## 2012-06-01 NOTE — Plan of Care (Signed)
Problem: Consults Goal: Diagnosis- Total Joint Replacement Outcome: Completed/Met Date Met:  06/01/12 Rotator cuff repair

## 2012-06-01 NOTE — Evaluation (Signed)
Occupational Therapy Evaluation Patient Details Name: Martin Bridges MRN: [id number] DOB: Jun 11, 1976 Today's Date: 06/01/2012 Time: 9-9:28  OT Assessment / Plan / Recommendation Clinical Impression  Pt presents to OT with decreased I with ADL activity s/p L shoulder surgery. Educated on ADL activity with no shoulder movement. Pt will benefit from OP when MD orders for LUE    OT Assessment  Patient does not need any further OT services    Follow Up Recommendations  Outpatient OT;Other (comment) (as MD indicates)       Equipment Recommendations  None recommended by OT          Precautions / Restrictions Precautions Precautions: Shoulder Type of Shoulder Precautions: No exercise per MD. Sling at all times. Instructed in ADL activity only. Wife will assist as needed       ADL  Upper Body Dressing: Performed;Minimal assistance (educated in donning sling /shirt with no shoulder movement) Where Assessed - Upper Body Dressing: Unsupported standing ADL Comments: Educated on importance of not using LUE for ADL activity and wearing sling at all times per MD order               Cognition  Overall Cognitive Status: Appears within functional limits for tasks assessed/performed Arousal/Alertness: Awake/alert Orientation Level: Appears intact for tasks assessed    Extremity/Trunk Assessment Right Upper Extremity Assessment RUE ROM/Strength/Tone: [facility] for tasks assessed Left Upper Extremity Assessment LUE ROM/Strength/Tone: Due to precautions   Mobility Bed Mobility Bed Mobility: Supine to Sit Supine to Sit: 5: Supervision Transfers Transfers: Sit to Stand;Stand to Sit Sit to Stand: 5: Supervision;Without upper extremity assist Stand to Sit: 5: Supervision;Without upper extremity assist         End of Session OT - End of Session Equipment Utilized During Treatment: Other (comment) (arm sling)   Yuleidy Rappleye, [place] Kung 06/01/2012, 10:31 AM

## 2012-06-01 NOTE — Progress Notes (Signed)
Pt to d/c home. AVS reviewed. Pt capable of verbalizing medications and follow-up appointments. Remains hemodynamically stable. No signs and symptoms of distress. Educated pt to return to ER in the case of SOB, dizziness, or chest pain.  Pt leaving with scripts. Going home with family. Wheeled out by a staff member. Instructed to follow up with Dr. Gioffre in 2 weeks.  

## 2012-06-01 NOTE — Op Note (Signed)
NAMECINDY, Martin Bridges NO.:  [id number]  MEDICAL RECORD NO.:  [id number]  LOCATION:  1608                         FACILITY:  [facility]  PHYSICIAN:  Georges Lynch. Dinesha Twiggs, M.D.DATE OF BIRTH:  1976-05-26  DATE OF PROCEDURE: DATE OF DISCHARGE:                              OPERATIVE REPORT   PREOPERATIVE DIAGNOSES: 1. Tear of the rotator cuff tendon, left shoulder. 2. Severe impingement, left shoulder. 3. Morbid obesity.  POSTOPERATIVE DIAGNOSES: 1. Tear of the rotator cuff tendon, left shoulder. 2. Severe impingement, left shoulder. 3. Morbid obesity.  SURGEON:  Georges Lynch. Darrelyn Hillock, M.D.  ASSISTANT:  Dimitri Ped, [place].  OPERATION: 1. Open acromionectomy and acromioplasty, left shoulder. 2. Exploration of the left rotator cuff with a TissueMend graft for     repair of the cuff.  No anchors were used.  This was complex     because of his morbid obesity. 3. Subdeltoid bursectomy.  PROCEDURE:  Under general anesthesia, routine orthopedic prep and draping was carried out with the patient in a beach chair position.  The prep was carried out in the left upper extremity.  Prior to surgery, we did the appropriate time-out.  Also, in the holding area I marked the appropriate left arm.  The patient had 2 g of IV Ancef.  He also had IV Tylenol.  At this time, I made an incision directly over the anterior aspect of the acromion.  Bleeders identified and cauterized.  I split a small portion of the proximal deltoid muscle.  I also dissected the deltoid tendon from the acromion by sharp dissection.  I then went down and noted a markedly thickened inflamed subdeltoid bursa.  I then did a subdeltoid bursectomy.  I then went down and noted the cup was quite thinned out exactly like the MRI showed.  There was no retraction of the tendon, but the cup was very thinned out from the severe impingement. The acromion was markedly thickened and slope downward.  I protected the underlying  cuff with a Bennett retractor and I utilized the oscillating saw and the burr to do a partial acromionectomy.  I then irrigated the area out and bone waxed the undersurface of the acromion.  I then applied a TissueMend graft in the usual fashion utilizing a #1 Ethibond suture to the thinned out area of the cuff.  I then irrigated the area out and reapproximated deltoid tendon muscle in usual fashion.  I injected 20 cc of Exparel into the soft tissue.  I aspirated several times to make sure we were not in the vessels.  The subcu was closed with Vicryl and a running subcuticular stitch was carried out with Monocryl.  Obviously, a sterile dressing was applied with large length.          ______________________________ Georges Lynch. Darrelyn Hillock, M.D.     RAG/MEDQ  D:  05/31/2012  T:  05/31/2012  Job:  [id number]

## 2012-06-02 ENCOUNTER — Encounter (HOSPITAL_COMMUNITY): Payer: Self-pay | Admitting: Orthopedic Surgery

## 2012-06-02 NOTE — OR Nursing (Signed)
Chart amended 06/02/12 per Delford Field, RN, BSN. Change to chart was removal of possible from Procedure name.

## 2012-12-13 DIAGNOSIS — I517 Cardiomegaly: Secondary | ICD-10-CM

## 2012-12-13 HISTORY — DX: Cardiomegaly: I51.7

## 2013-03-01 ENCOUNTER — Encounter: Payer: Self-pay | Admitting: Cardiovascular Disease

## 2013-03-07 DIAGNOSIS — K219 Gastro-esophageal reflux disease without esophagitis: Secondary | ICD-10-CM | POA: Insufficient documentation

## 2013-03-07 DIAGNOSIS — G4730 Sleep apnea, unspecified: Secondary | ICD-10-CM | POA: Insufficient documentation

## 2013-03-07 DIAGNOSIS — J449 Chronic obstructive pulmonary disease, unspecified: Secondary | ICD-10-CM | POA: Insufficient documentation

## 2013-03-07 DIAGNOSIS — F32A Depression, unspecified: Secondary | ICD-10-CM | POA: Insufficient documentation

## 2013-03-07 DIAGNOSIS — F329 Major depressive disorder, single episode, unspecified: Secondary | ICD-10-CM | POA: Insufficient documentation

## 2013-03-08 ENCOUNTER — Encounter: Payer: Self-pay | Admitting: *Deleted

## 2013-03-08 ENCOUNTER — Ambulatory Visit (INDEPENDENT_AMBULATORY_CARE_PROVIDER_SITE_OTHER): Payer: Self-pay | Admitting: Cardiovascular Disease

## 2013-03-08 ENCOUNTER — Encounter: Payer: Self-pay | Admitting: Cardiovascular Disease

## 2013-03-08 VITALS — BP 125/79 | HR 96 | Ht 75.0 in | Wt 382.0 lb

## 2013-03-08 DIAGNOSIS — R0609 Other forms of dyspnea: Secondary | ICD-10-CM

## 2013-03-08 DIAGNOSIS — F32A Depression, unspecified: Secondary | ICD-10-CM

## 2013-03-08 DIAGNOSIS — R0600 Dyspnea, unspecified: Secondary | ICD-10-CM

## 2013-03-08 DIAGNOSIS — G4730 Sleep apnea, unspecified: Secondary | ICD-10-CM

## 2013-03-08 DIAGNOSIS — J449 Chronic obstructive pulmonary disease, unspecified: Secondary | ICD-10-CM

## 2013-03-08 DIAGNOSIS — F329 Major depressive disorder, single episode, unspecified: Secondary | ICD-10-CM

## 2013-03-08 DIAGNOSIS — R079 Chest pain, unspecified: Secondary | ICD-10-CM | POA: Insufficient documentation

## 2013-03-08 NOTE — Progress Notes (Signed)
Patient ID: Martin Bridges, male   DOB: May 15, 1976, 37 y.o.   MRN: [id number] 37 yo referred by PA Spencer for atypical chest pain.  Reviewed records from [facility] [place] Overnight for pain No ECG changes Troponin negative x 4 Recent mood disorder and panic attacks. Not taking meds the way he should Had sharp non exertional central chest pains. No pleuritic component pain lasted a few hours.  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  No recurrence of pain since discharge from ER 3/35  ROS: Denies fever, malais, weight loss, blurry vision, decreased visual acuity, cough, sputum, SOB, hemoptysis, pleuritic pain, palpitaitons, heartburn, abdominal pain, melena, lower extremity edema, claudication, or rash.  All other systems reviewed and negative   General: Affect appropriate Obese white male HEENT: normal Neck supple with no adenopathy JVP normal no bruits no thyromegaly Lungs clear with no wheezing and good diaphragmatic motion Heart:  S1/S2 no murmur,rub, gallop or click PMI normal Abdomen: benighn, BS positve, no tenderness, no AAA no bruit.  No HSM or HJR Distal pulses intact with no bruits No edema Neuro non-focal Skin warm and dry tatoos No muscular weakness  Medications Current Outpatient Prescriptions  Medication Sig Dispense Refill  . albuterol (PROVENTIL HFA;VENTOLIN HFA) 108 (90 BASE) MCG/ACT inhaler Inhale 2 puffs into the lungs every 6 (six) hours as needed.      Marland Kitchen albuterol (PROVENTIL) (2.5 MG/3ML) 0.083% nebulizer solution Take 2.5 mg by nebulization every 6 (six) hours as needed.      . sertraline (ZOLOFT) 100 MG tablet Take 100 mg by mouth every morning.       . traZODone (DESYREL) 100 MG tablet Take 100 mg by mouth at bedtime.       No current facility-administered medications for this visit.    Allergies Other  Family  History: History reviewed. No pertinent family history.  Social History: History   Social History  . Marital Status: Married    Spouse Name: N/A    Number of Children: N/A  . Years of Education: N/A   Occupational History  . Not on file.   Social History Main Topics  . Smoking status: Former Smoker    Types: Cigars    Quit date: 12/13/1998  . Smokeless tobacco: Current User    Types: Snuff, Chew  . Alcohol Use: Yes     Comment: rare  . Drug Use: No  . Sexually Active:    Other Topics Concern  . Not on file   Social History Narrative  . No narrative on file    Electrocardiogram:  NSR normal ECG rate 96  Assessment and Plan

## 2013-03-08 NOTE — Assessment & Plan Note (Signed)
Quit smoking in 2002  Dyspnea from obesity With OSA and recent ER visit for [facility]SC[facility] will get echo

## 2013-03-08 NOTE — Assessment & Plan Note (Signed)
Atypical normal ECG  F/U ETT  

## 2013-03-08 NOTE — Assessment & Plan Note (Signed)
Mood disorder and panic attacks. Discussed compliance with meds.  F/U primary

## 2013-03-08 NOTE — Assessment & Plan Note (Signed)
Discussed role of weight loss and compliance with CPAP

## 2013-03-08 NOTE — Patient Instructions (Addendum)
Your physician recommends that you continue on your current medications as directed. Please refer to the Current Medication list given to you today.  Your physician has requested that you have an exercise tolerance test. For further information please visit [email or web address] Please also follow instruction sheet, as given.DX CHEST PAIN  Your physician has requested that you have an echocardiogram. Echocardiography is a painless test that uses sound waves to create images of your heart. It provides your doctor with information about the size and shape of your heart and how well your heart's chambers and valves are working. This procedure takes approximately one hour. There are no restrictions for this procedure. DX DYSPNEA

## 2013-03-13 ENCOUNTER — Ambulatory Visit (HOSPITAL_COMMUNITY): Payer: Self-pay | Attending: Cardiology

## 2013-03-13 DIAGNOSIS — R0609 Other forms of dyspnea: Secondary | ICD-10-CM | POA: Insufficient documentation

## 2013-03-13 DIAGNOSIS — R0989 Other specified symptoms and signs involving the circulatory and respiratory systems: Secondary | ICD-10-CM | POA: Insufficient documentation

## 2013-03-13 DIAGNOSIS — R0600 Dyspnea, unspecified: Secondary | ICD-10-CM

## 2013-03-13 DIAGNOSIS — R0602 Shortness of breath: Secondary | ICD-10-CM

## 2013-03-13 NOTE — Progress Notes (Signed)
Echocardiogram performed.  

## 2013-03-30 ENCOUNTER — Encounter: Payer: Self-pay | Admitting: Physician Assistant

## 2013-04-05 ENCOUNTER — Ambulatory Visit (INDEPENDENT_AMBULATORY_CARE_PROVIDER_SITE_OTHER): Payer: Self-pay | Admitting: Physician Assistant

## 2013-04-05 DIAGNOSIS — R079 Chest pain, unspecified: Secondary | ICD-10-CM

## 2013-04-05 NOTE — Procedures (Signed)
Exercise Treadmill Test  Pre-Exercise Testing Evaluation Rhythm: normal sinus  Rate: 91     Test  Exercise Tolerance Test Ordering MD: Charlton Haws, MD  Interpreting MD: Tereso Newcomer PA-C  Unique Test No: 1  Treadmill:  1  Indication for ETT: chest pain  Contraindication to ETT: No   Stress Modality: exercise - treadmill  Cardiac Imaging Performed: non   Protocol: standard Bruce - maximal  Max BP:  142/52  Max MPHR (bpm):  185 85% MPR (bpm):  157  MPHR obtained (bpm):  160 % MPHR obtained:  86%  Reached 85% MPHR (min:sec):  6:20 Total Exercise Time (min-sec):  6:32  Workload in METS:  7.2 Borg Scale: 15  Reason ETT Terminated:  dyspnea    ST Segment Analysis At Rest: normal ST segments - no evidence of significant ST depression With Exercise: no evidence of significant ST depression  Other Information Arrhythmia:  No Angina during ETT:  present (1) Quality of ETT:  diagnostic  ETT Interpretation:  normal - no evidence of ischemia by ST analysis  Comments: Fair exercise tolerance. He did c/o chest tightness and dyspnea.  There was wheezing (patient has mild intermittent asthma). Normal BP response to exercise. No ST-T changes to suggest ischemia.   Recommendations: F/u with Dr. Charlton Haws as directed. Signed, Tereso Newcomer, PA-C  10:11 AM 04/05/2013

## 2014-03-17 ENCOUNTER — Emergency Department (INDEPENDENT_AMBULATORY_CARE_PROVIDER_SITE_OTHER): Payer: PRIVATE HEALTH INSURANCE

## 2014-03-17 ENCOUNTER — Emergency Department (INDEPENDENT_AMBULATORY_CARE_PROVIDER_SITE_OTHER)
Admission: EM | Admit: 2014-03-17 | Discharge: 2014-03-17 | Disposition: A | Discharge status: Home / Self Care | Payer: PRIVATE HEALTH INSURANCE | Source: Home / Self Care | Attending: Emergency Medicine | Admitting: Emergency Medicine

## 2014-03-17 ENCOUNTER — Encounter (HOSPITAL_COMMUNITY): Payer: Self-pay | Admitting: Emergency Medicine

## 2014-03-17 DIAGNOSIS — M25569 Pain in unspecified knee: Secondary | ICD-10-CM

## 2014-03-17 DIAGNOSIS — M7120 Synovial cyst of popliteal space [Baker], unspecified knee: Secondary | ICD-10-CM

## 2014-03-17 MED ORDER — HYDROCODONE-ACETAMINOPHEN 5-325 MG PO TABS: 1.0000 | ORAL_TABLET | Freq: Four times a day (QID) | ORAL | Status: DC | PRN

## 2014-03-17 MED ORDER — ETODOLAC 500 MG PO TABS: 500.0000 mg | ORAL_TABLET | Freq: Two times a day (BID) | ORAL | Status: DC

## 2014-03-17 NOTE — ED Notes (Signed)
38 yr old male is here today with complaints of Right knee pain for the past three weeks. He states he has not fallen or injured his right knee. He states the pain is most in the back of knee where he bends it. 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 Denies: any recent surgeries, sob, chest pain

## 2014-03-17 NOTE — ED Provider Notes (Signed)
Medical screening examination/treatment/procedure(s) were performed by non-physician practitioner and as supervising physician I was immediately available for consultation/collaboration.  Josanna Hefel, M.D.  Arjay Jaskiewicz C Mickelle Goupil, MD 03/17/14 2123 

## 2014-03-17 NOTE — ED Provider Notes (Signed)
CSN: [id number]     Arrival date & time 03/17/14  1741 History   First MD Initiated Contact with Patient 03/17/14 1844     No chief complaint on file.  (Consider location/radiation/quality/duration/timing/severity/associated sxs/prior Treatment) HPI Comments: 38 year old male presents complaining of progressively worsening right knee pain for the past 3 weeks. He feels pain across the top and front of the knee when he bends it, but when he extended order is just standing he mostly feels pain in the back of the knee. He denies any injury to the knee or any swelling of the knee. He has no previous history of knee pain. He denies any swelling of the knee. No previous history of arthritis.   Past Medical History  Diagnosis Date  . COPD (chronic obstructive pulmonary disease)   . Asthma   . Depression   . Emphysema   . GERD (gastroesophageal reflux disease)   . Sleep apnea     stopbang=4   Past Surgical History  Procedure Laterality Date  . No past surgeries    . Shoulder open rotator cuff repair  05/31/2012    Procedure: ROTATOR CUFF REPAIR SHOULDER OPEN;  Surgeon: Jacki Conesonald A Gioffre, MD;  Location: [facility];  Service: Orthopedics;  Laterality: Left;  with graft   No family history on file. History  Substance Use Topics  . Smoking status: Former Smoker    Types: Cigars    Quit date: 12/13/1998  . Smokeless tobacco: Current User    Types: Snuff, Chew  . Alcohol Use: Yes     Comment: rare    Review of Systems  Musculoskeletal: Positive for arthralgias. Negative for joint swelling.  Neurological: Negative for numbness.  All other systems reviewed and are negative.    Allergies  Other  Home Medications   Current Outpatient Rx  Name  Route  Sig  Dispense  Refill  . albuterol (PROVENTIL HFA;VENTOLIN HFA) 108 (90 BASE) MCG/ACT inhaler   Inhalation   Inhale 2 puffs into the lungs every 6 (six) hours as needed.         Marland Kitchen. albuterol (PROVENTIL) (2.5 MG/3ML) 0.083% nebulizer  solution   Nebulization   Take 2.5 mg by nebulization every 6 (six) hours as needed.         . etodolac (LODINE) 500 MG tablet   Oral   Take 1 tablet (500 mg total) by mouth 2 (two) times daily.   60 tablet   2   . HYDROcodone-acetaminophen (NORCO) 5-325 MG per tablet   Oral   Take 1 tablet by mouth every 6 (six) hours as needed for moderate pain.   20 tablet   0   . sertraline (ZOLOFT) 100 MG tablet   Oral   Take 100 mg by mouth every morning.          . traZODone (DESYREL) 100 MG tablet   Oral   Take 100 mg by mouth at bedtime.          BP 140/87  Pulse 102  Temp(Src) 98.7 F (37.1 C) (Oral)  Resp 18  SpO2 100% Physical Exam  Nursing note and vitals reviewed. Constitutional: He is oriented to person, place, and time. He appears well-developed and well-nourished. No distress.  Morbidly obese body habitus  HENT:  Head: Normocephalic.  Pulmonary/Chest: Effort normal. No respiratory distress.  Musculoskeletal:       Right knee: He exhibits abnormal meniscus (medial). He exhibits normal range of motion, no swelling, no effusion, no ecchymosis, no deformity,  no erythema, normal alignment, no LCL laxity, normal patellar mobility and no MCL laxity. Tenderness found. Medial joint line and lateral joint line tenderness noted.  Tender Henretta Quist's cyst in the back of the right knee  Neurological: He is alert and oriented to person, place, and time. Coordination normal.  Skin: Skin is warm and dry. No rash noted. He is not diaphoretic.  Psychiatric: He has a normal mood and affect. Judgment normal.    ED Course  Procedures (including critical care time) Labs Review Labs Reviewed - No data to display Imaging Review Dg Knee Bilateral Standing Ap  03/17/2014   CLINICAL DATA:  Knee pain.  EXAM: BILATERAL KNEES STANDING - 1 VIEW  COMPARISON:  None.  FINDINGS: The joint spaces are maintained. No degenerative changes or acute bony findings.  IMPRESSION: No acute bony findings or  degenerative changes.   Electronically Signed   By: Loralie Champagne M.D.   On: 03/17/2014 19:24     MDM   1. Knee pain   2. Amilliana Hayworth's cyst of knee    Possible meniscus injury. We'll treat with twice a day Lodine and when necessary Norco, and refer to orthopedics for followup.   Meds ordered this encounter  Medications  . etodolac (LODINE) 500 MG tablet    Sig: Take 1 tablet (500 mg total) by mouth 2 (two) times daily.    Dispense:  60 tablet    Refill:  2    Order Specific Question:  Supervising Provider    Answer:  Lorenz Coaster, DAVID C [id number]  . HYDROcodone-acetaminophen (NORCO) 5-325 MG per tablet    Sig: Take 1 tablet by mouth every 6 (six) hours as needed for moderate pain.    Dispense:  20 tablet    Refill:  0    Order Specific Question:  Supervising Provider    Answer:  Lorenz Coaster, DAVID C [[id number]]       Graylon Good, PA-C 03/17/14 [phone]

## 2014-03-17 NOTE — Discharge Instructions (Signed)
Martin Bridges Martin Bridges Martin Bridges Martin Bridges is Martin sac-like structure that forms in the back of the knee. It is filled with the same fluid that is located in your knee. This fluid lubricates the bones and cartilage of the knee and allows them to move over each other more easily. CAUSES  When the knee becomes injured or inflamed, increased fluid forms in the knee. When this happens, the joint lining is pushed out behind the knee and forms the Martin Bridges Martin Bridges. This Martin Bridges may also be caused by inflammation from arthritic conditions and infections. SIGNS AND SYMPTOMS  Martin Martin Bridges Martin Bridges usually has no symptoms. When the Martin Bridges is substantially enlarged:  You may feel pressure behind the knee, stiffness in the knee, or Martin mass in the area behind the knee.  You may develop pain, redness, and swelling in the calf. This can suggest Martin blood clot and requires evaluation by your health care provider. DIAGNOSIS  Martin Martin Bridges Martin Bridges is most often found during an ultrasound exam. This exam may have been performed for other reasons, and the Martin Bridges was found incidentally. Sometimes an MRI is used. This picks up other problems within Martin joint that an ultrasound exam may not. If the Martin Bridges Martin Bridges developed immediately after an injury, X-ray exams may be used to diagnose the Martin Bridges. TREATMENT  The treatment depends on the cause of the Martin Bridges. Anti-inflammatory medicines and rest often will be prescribed. If the Martin Bridges is caused by Martin bacterial infection, antibiotic medicines may be prescribed.  HOME CARE INSTRUCTIONS   If the Martin Bridges was caused by an injury, for the first 24 hours, keep the injured leg elevated on 2 pillows while lying down.  For the first 24 hours while you are awake, apply ice to the injured area:  Put ice in Martin plastic bag.  Place Martin towel between your skin and the bag.  Leave the ice on for 20 minutes, 2 3 times Martin day.  Only take over-the-counter or prescription medicines for pain, discomfort, or fever as directed by your health care  provider.  Only take antibiotic medicine as directed. Make sure to finish it even if you start to feel better. MAKE SURE YOU:   Understand these instructions.  Will watch your condition.  Will get help right away if you are not doing well or get worse. Document Released: 11/29/2005 Document Revised: 09/19/2013 Document Reviewed: 07/11/2013 [facility] Patient Information 2014 [place]Care, [place].  Knee Pain Knee pain can be Martin result of an injury or other medical conditions. Treatment will depend on the cause of your pain. HOME CARE  Only take medicine as told by your doctor.  Keep Martin healthy weight. Being overweight can make the knee hurt more.  Stretch before exercising or playing sports.  If there is constant knee pain, change the way you exercise. Ask your doctor for advice.  Make sure shoes fit well. Choose the right shoe for the sport or activity.  Protect your knees. Wear kneepads if needed.  Rest when you are tired. GET HELP RIGHT AWAY IF:   Your knee pain does not stop.  Your knee pain does not get better.  Your knee joint feels hot to the touch.  You have Martin fever. MAKE SURE YOU:   Understand these instructions.  Will watch this condition.  Will get help right away if you are not doing well or get worse. Document Released: 02/25/2009 Document Revised: 02/21/2012 Document Reviewed: 02/25/2009 [facility] LPExit[facility] Patient Information 2014 [place] LakeExitCare, [place]LL[place].

## 2014-12-08 ENCOUNTER — Emergency Department (HOSPITAL_COMMUNITY): Payer: BC Managed Care – PPO

## 2014-12-08 ENCOUNTER — Emergency Department (HOSPITAL_COMMUNITY)
Admission: EM | Admit: 2014-12-08 | Discharge: 2014-12-09 | Disposition: A | Discharge status: Home / Self Care | Payer: BC Managed Care – PPO | Attending: Emergency Medicine | Admitting: Emergency Medicine

## 2014-12-08 ENCOUNTER — Encounter (HOSPITAL_COMMUNITY): Payer: Self-pay | Admitting: Emergency Medicine

## 2014-12-08 DIAGNOSIS — W1789XA Other fall from one level to another, initial encounter: Secondary | ICD-10-CM | POA: Insufficient documentation

## 2014-12-08 DIAGNOSIS — F329 Major depressive disorder, single episode, unspecified: Secondary | ICD-10-CM | POA: Insufficient documentation

## 2014-12-08 DIAGNOSIS — S99912A Unspecified injury of left ankle, initial encounter: Secondary | ICD-10-CM | POA: Insufficient documentation

## 2014-12-08 DIAGNOSIS — J449 Chronic obstructive pulmonary disease, unspecified: Secondary | ICD-10-CM | POA: Insufficient documentation

## 2014-12-08 DIAGNOSIS — M25562 Pain in left knee: Secondary | ICD-10-CM

## 2014-12-08 DIAGNOSIS — Y9289 Other specified places as the place of occurrence of the external cause: Secondary | ICD-10-CM | POA: Insufficient documentation

## 2014-12-08 DIAGNOSIS — Z791 Long term (current) use of non-steroidal anti-inflammatories (NSAID): Secondary | ICD-10-CM | POA: Diagnosis not present

## 2014-12-08 DIAGNOSIS — Y9389 Activity, other specified: Secondary | ICD-10-CM | POA: Diagnosis not present

## 2014-12-08 DIAGNOSIS — S70311A Abrasion, right thigh, initial encounter: Secondary | ICD-10-CM | POA: Diagnosis not present

## 2014-12-08 DIAGNOSIS — W19XXXA Unspecified fall, initial encounter: Secondary | ICD-10-CM

## 2014-12-08 DIAGNOSIS — Y998 Other external cause status: Secondary | ICD-10-CM | POA: Insufficient documentation

## 2014-12-08 DIAGNOSIS — Z79899 Other long term (current) drug therapy: Secondary | ICD-10-CM | POA: Insufficient documentation

## 2014-12-08 DIAGNOSIS — G4730 Sleep apnea, unspecified: Secondary | ICD-10-CM | POA: Diagnosis not present

## 2014-12-08 DIAGNOSIS — M25572 Pain in left ankle and joints of left foot: Secondary | ICD-10-CM

## 2014-12-08 DIAGNOSIS — S8992XA Unspecified injury of left lower leg, initial encounter: Secondary | ICD-10-CM | POA: Diagnosis present

## 2014-12-08 DIAGNOSIS — Z8719 Personal history of other diseases of the digestive system: Secondary | ICD-10-CM | POA: Diagnosis not present

## 2014-12-08 DIAGNOSIS — Z87891 Personal history of nicotine dependence: Secondary | ICD-10-CM | POA: Insufficient documentation

## 2014-12-08 DIAGNOSIS — Z23 Encounter for immunization: Secondary | ICD-10-CM | POA: Insufficient documentation

## 2014-12-08 DIAGNOSIS — Y92009 Unspecified place in unspecified non-institutional (private) residence as the place of occurrence of the external cause: Secondary | ICD-10-CM

## 2014-12-08 MED ORDER — OXYCODONE-ACETAMINOPHEN 5-325 MG PO TABS: 1.0000 | ORAL_TABLET | Freq: Four times a day (QID) | ORAL | Status: DC | PRN

## 2014-12-08 MED ORDER — OXYCODONE-ACETAMINOPHEN 5-325 MG PO TABS
1.0000 | ORAL_TABLET | Freq: Once | ORAL | Status: AC
  Administered 2014-12-08: 1 via ORAL
  Filled 2014-12-08: qty 1

## 2014-12-08 MED ORDER — TETANUS-DIPHTH-ACELL PERTUSSIS 5-2.5-18.5 LF-MCG/0.5 IM SUSP
0.5000 mL | Freq: Once | INTRAMUSCULAR | Status: AC
  Administered 2014-12-08: 0.5 mL via INTRAMUSCULAR
  Filled 2014-12-08: qty 0.5

## 2014-12-08 NOTE — ED Notes (Signed)
Pt reports that he fell through a rotten wooden porch and stopped at his hip level. Pt noted to have scratches to bilateral lower extremities with swelling and lac below L knee. Pt reports most pain to L knee and ankle. Pt denies taking anything for pain, ambulatory to triage.

## 2014-12-08 NOTE — Discharge Instructions (Signed)
Cryotherapy °Cryotherapy means treatment with cold. Ice or gel packs can be used to reduce both pain and swelling. Ice is the most helpful within the first 24 to 48 hours after an injury or flare-up from overusing a muscle or joint. Sprains, strains, spasms, burning pain, shooting pain, and aches can all be eased with ice. Ice can also be used when recovering from surgery. Ice is effective, has very few side effects, and is safe for most people to use. °PRECAUTIONS  °Ice is not a safe treatment option for people with: °· Raynaud phenomenon. This is a condition affecting small blood vessels in the extremities. Exposure to cold may cause your problems to return. °· Cold hypersensitivity. There are many forms of cold hypersensitivity, including: °¨ Cold urticaria. Red, itchy hives appear on the skin when the tissues begin to warm after being iced. °¨ Cold erythema. This is a red, itchy rash caused by exposure to cold. °¨ Cold hemoglobinuria. Red blood cells break down when the tissues begin to warm after being iced. The hemoglobin that carry oxygen are passed into the urine because they cannot combine with blood proteins fast enough. °· Numbness or altered sensitivity in the area being iced. °If you have any of the following conditions, do not use ice until you have discussed cryotherapy with your caregiver: °· Heart conditions, such as arrhythmia, angina, or chronic heart disease. °· High blood pressure. °· Healing wounds or open skin in the area being iced. °· Current infections. °· Rheumatoid arthritis. °· Poor circulation. °· Diabetes. °Ice slows the blood flow in the region it is applied. This is beneficial when trying to stop inflamed tissues from spreading irritating chemicals to surrounding tissues. However, if you expose your skin to cold temperatures for too long or without the proper protection, you can damage your skin or nerves. Watch for signs of skin damage due to cold. °HOME CARE INSTRUCTIONS °Follow  these tips to use ice and cold packs safely. °· Place a dry or damp towel between the ice and skin. A damp towel will cool the skin more quickly, so you may need to shorten the time that the ice is used. °· For a more rapid response, add gentle compression to the ice. °· Ice for no more than 10 to 20 minutes at a time. The bonier the area you are icing, the less time it will take to get the benefits of ice. °· Check your skin after 5 minutes to make sure there are no signs of a poor response to cold or skin damage. °· Rest 20 minutes or more between uses. °· Once your skin is numb, you can end your treatment. You can test numbness by very lightly touching your skin. The touch should be so light that you do not see the skin dimple from the pressure of your fingertip. When using ice, most people will feel these normal sensations in this order: cold, burning, aching, and numbness. °· Do not use ice on someone who cannot communicate their responses to pain, such as small children or people with dementia. °HOW TO MAKE AN ICE PACK °Ice packs are the most common way to use ice therapy. Other methods include ice massage, ice baths, and cryosprays. Muscle creams that cause a cold, tingly feeling do not offer the same benefits that ice offers and should not be used as a substitute unless recommended by your caregiver. °To make an ice pack, do one of the following: °· Place crushed ice or a   bag of frozen vegetables in a sealable plastic bag. Squeeze out the excess air. Place this bag inside another plastic bag. Slide the bag into a pillowcase or place a damp towel between your skin and the bag. °· Mix 3 parts water with 1 part rubbing alcohol. Freeze the mixture in a sealable plastic bag. When you remove the mixture from the freezer, it will be slushy. Squeeze out the excess air. Place this bag inside another plastic bag. Slide the bag into a pillowcase or place a damp towel between your skin and the bag. °SEEK MEDICAL CARE  IF: °· You develop white spots on your skin. This may give the skin a blotchy (mottled) appearance. °· Your skin turns blue or pale. °· Your skin becomes waxy or hard. °· Your swelling gets worse. °MAKE SURE YOU:  °· Understand these instructions. °· Will watch your condition. °· Will get help right away if you are not doing well or get worse. °Document Released: 07/26/2011 Document Revised: 04/15/2014 Document Reviewed: 07/26/2011 °ExitCare® Patient Information ©2015 ExitCare, LLC. This information is not intended to replace advice given to you by your health care provider. Make sure you discuss any questions you have with your health care provider. ° °

## 2014-12-08 NOTE — ED Notes (Signed)
Patient transported to X-ray 

## 2014-12-08 NOTE — ED Provider Notes (Signed)
CSN: [id number]     Arrival date & time 12/08/14  2252 History  This chart was scribed for non-physician practitioner Elpidio AnisShari Myeisha Kruser, PA-C working with Hanley SeamenJohn L Molpus, MD by Murriel HopperAlec Bankhead, ED Scribe. This patient was seen in room WTR5/WTR5 and the patient's care was started at 11:11 PM.     Chief Complaint  Patient presents with  . Fall    The history is provided by the patient. No language interpreter was used.    HPI Comments: Martin Bridges is a 38 y.o. male who presents to the Emergency Department complaining of left knee and left ankle pain with an associated skin abrasion to his right thigh that has been present since earlier this evening after pt fell through his rotten wooden porch. Pt states that he fell through the porch and stopped at his hip level. Pt states that his main concerns are his left knee and left ankle, which he has had problems with before. Pt states that he only has pain in left knee and ankle when he is weight-bearing. Pt notes that he hit his head during the fall but denies LOC.    Past Medical History  Diagnosis Date  . COPD (chronic obstructive pulmonary disease)   . Asthma   . Depression   . Emphysema   . GERD (gastroesophageal reflux disease)   . Sleep apnea     stopbang=4   Past Surgical History  Procedure Laterality Date  . Shoulder open rotator cuff repair  05/31/2012    Procedure: ROTATOR CUFF REPAIR SHOULDER OPEN;  Surgeon: Jacki Conesonald A Gioffre, MD;  Location: [facility];  Service: Orthopedics;  Laterality: Left;  with graft   History reviewed. No pertinent family history. History  Substance Use Topics  . Smoking status: Former Smoker    Types: Cigars    Quit date: 12/13/1998  . Smokeless tobacco: Current User    Types: Snuff, Chew  . Alcohol Use: Yes     Comment: rare    Review of Systems  Gastrointestinal: Negative for abdominal pain.  Musculoskeletal: Positive for arthralgias.       See HPI.  Skin: Positive for wound.  Neurological:  Negative for syncope and headaches.      Allergies  Other  Home Medications   Prior to Admission medications   Medication Sig Start Date End Date Taking? Authorizing Provider  albuterol (PROVENTIL HFA;VENTOLIN HFA) 108 (90 BASE) MCG/ACT inhaler Inhale 2 puffs into the lungs every 6 (six) hours as needed.    Historical Provider, MD  albuterol (PROVENTIL) (2.5 MG/3ML) 0.083% nebulizer solution Take 2.5 mg by nebulization every 6 (six) hours as needed.    Historical Provider, MD  etodolac (LODINE) 500 MG tablet Take 1 tablet (500 mg total) by mouth 2 (two) times daily. 03/17/14   Graylon GoodZachary H Baker, PA-C  HYDROcodone-acetaminophen (NORCO) 5-325 MG per tablet Take 1 tablet by mouth every 6 (six) hours as needed for moderate pain. 03/17/14   Graylon GoodZachary H Baker, PA-C  sertraline (ZOLOFT) 100 MG tablet Take 100 mg by mouth every morning.     Historical Provider, MD  traZODone (DESYREL) 100 MG tablet Take 100 mg by mouth at bedtime.    Historical Provider, MD   BP 130/93 mmHg  Pulse 120  Temp(Src) 98.7 F (37.1 C) (Oral)  Resp 20  Ht 6\' 3"  (1.905 m)  Wt 360 lb (163.295 kg)  BMI 45.00 kg/m2  SpO2 99% Physical Exam  Constitutional: He is oriented to person, place, and time. He  appears well-developed and well-nourished.  Neck: Normal range of motion.  Pulmonary/Chest: Effort normal.  Musculoskeletal: Normal range of motion.  Left knee and ankle joints stable. Mild swelling of medial and lateral ankle without bony deformity. No significant discoloration. Achilles intact. No calf or shin tenderness. Anterior knee tenderness that is mild. FROM all joints. Weight bearing.  Neurological: He is alert and oriented to person, place, and time.  Skin: Skin is warm and dry.  Small superficial laceration proximal anterior LE not requiring repair. Abrasion right inner proximal thigh without significant swelling or ecchymosis.  Psychiatric: He has a normal mood and affect.    ED Course  Procedures (including  critical care time)  DIAGNOSTIC STUDIES: Oxygen Saturation is 99% on RA, normal by my interpretation.    COORDINATION OF CARE: 11:13 PM Discussed treatment plan with pt at bedside and pt agreed to plan.   Labs Review Labs Reviewed - No data to display  Imaging Review No results found.   EKG Interpretation None     Dg Ankle Complete Left  12/08/2014   CLINICAL DATA:  Fall through deck with left ankle pain. Initial encounter  EXAM: LEFT ANKLE COMPLETE - 3+ VIEW  COMPARISON:  None.  FINDINGS: There is no evidence of fracture, dislocation, or joint effusion.  IMPRESSION: Negative.   Electronically Signed   By: Tiburcio PeaJonathan  Watts M.D.   On: 12/08/2014 23:36   Dg Knee Complete 4 Views Left  12/08/2014   CLINICAL DATA:  Larey SeatFell through deck tonight. Small abrasion to the left knee below patella. Pain with walking. Initial encounter.  EXAM: LEFT KNEE - COMPLETE 4+ VIEW  COMPARISON:  03/17/2014  FINDINGS: Degenerative spurring along the inferior patella and along the tibial spines. Joint spaces are maintained. No joint effusion. No fracture, subluxation or dislocation.  IMPRESSION: No acute bony abnormality.   Electronically Signed   By: Charlett NoseKevin  Dover M.D.   On: 12/08/2014 23:36    MDM   Final diagnoses:  None    1. Fall 2. Left arthralgia 3. Abrasion left leg  Negative imaging for fracture. He is ambulatory. Pain managed. He is stable for discharge home.   I personally performed the services described in this documentation, which was scribed in my presence. The recorded information has been reviewed and is accurate.     Arnoldo HookerShari A Nadir Vasques, PA-C 12/08/14 2351  Linwood DibblesJon Knapp, MD 12/10/14 1050

## 2016-06-22 ENCOUNTER — Emergency Department (HOSPITAL_COMMUNITY)
Admission: EM | Admit: 2016-06-22 | Discharge: 2016-06-22 | Disposition: A | Discharge status: Home / Self Care | Payer: PRIVATE HEALTH INSURANCE

## 2018-09-20 ENCOUNTER — Other Ambulatory Visit: Payer: Self-pay | Admitting: Urology

## 2018-09-26 ENCOUNTER — Encounter (HOSPITAL_COMMUNITY): Payer: Self-pay

## 2018-09-26 NOTE — Patient Instructions (Signed)
Your procedure is scheduled on: Wednesday, Oct. 23, 2019   Surgery Time:  8:15AM-9:45AM   Report to [facility] Main  Entrance    Report to admitting at 6:15 AM   Call this number if you have problems the morning of surgery [phone]   Do not eat food or drink liquids :After Midnight.   Brush your teeth the morning of surgery.   Do NOT smoke after Midnight   Take these medicines the morning of surgery with A SIP OF WATER: None   Bring Asthma Inhaler day of surgery   Bring CPAP mask and tubing day of surgery                               You may not have any metal on your body including jewelry, and body piercings             Do not wear lotions, powders, perfumes/cologne, or deodorant                          Men may shave face and neck.   Do not bring valuables to the hospital. [facility] IS NOT             RESPONSIBLE   FOR VALUABLES.   Contacts, dentures or bridgework may not be worn into surgery.   Leave suitcase in the car. After surgery it may be brought to your room.    Patients discharged the day of surgery will not be allowed to drive home.   Name and phone number of your driver:   Special Instructions: Bring a copy of your healthcare power of attorney and living will documents         the day of surgery if you haven't scanned them in before.              Please read over the following fact sheets you were given:  [facility] - Preparing for Surgery Before surgery, you can play an important role.  Because skin is not sterile, your skin needs to be as free of germs as possible.  You can reduce the number of germs on your skin by washing with CHG (chlorahexidine gluconate) soap before surgery.  CHG is an antiseptic cleaner which kills germs and bonds with the skin to continue killing germs even after washing. Please DO NOT use if you have an allergy to CHG or antibacterial soaps.  If your skin becomes reddened/irritated stop using the CHG and  inform your nurse when you arrive at Short Stay. Do not shave (including legs and underarms) for at least 48 hours prior to the first CHG shower.  You may shave your face/neck.  Please follow these instructions carefully:  1.  Shower with CHG Soap the night before surgery and the  morning of surgery.  2.  If you choose to wash your hair, wash your hair first as usual with your normal  shampoo.  3.  After you shampoo, rinse your hair and body thoroughly to remove the shampoo.                             4.  Use CHG as you would any other liquid soap.  You can apply chg directly to the skin and wash.  Gently with a scrungie or clean washcloth.  5.  Apply the CHG Soap to your body ONLY FROM THE NECK DOWN.   Do   not use on face/ open                           Wound or open sores. Avoid contact with eyes, ears mouth and   genitals (private parts).                       Wash face,  Genitals (private parts) with your normal soap.             6.  Wash thoroughly, paying special attention to the area where your    surgery  will be performed.  7.  Thoroughly rinse your body with warm water from the neck down.  8.  DO NOT shower/wash with your normal soap after using and rinsing off the CHG Soap.                9.  Pat yourself dry with a clean towel.            10.  Wear clean pajamas.            11.  Place clean sheets on your bed the night of your first shower and do not  sleep with pets. Day of Surgery : Do not apply any lotions/deodorants the morning of surgery.  Please wear clean clothes to the hospital/surgery center.  FAILURE TO FOLLOW THESE INSTRUCTIONS MAY RESULT IN THE CANCELLATION OF YOUR SURGERY  PATIENT SIGNATURE_________________________________  NURSE SIGNATURE__________________________________  ________________________________________________________________________

## 2018-09-27 ENCOUNTER — Other Ambulatory Visit: Payer: Self-pay

## 2018-09-27 ENCOUNTER — Encounter (HOSPITAL_COMMUNITY)
Admission: RE | Admit: 2018-09-27 | Discharge: 2018-09-27 | Disposition: A | Discharge status: Home / Self Care | Payer: Managed Care, Other (non HMO) | Source: Ambulatory Visit | Attending: Urology | Admitting: Urology

## 2018-09-27 ENCOUNTER — Encounter (HOSPITAL_COMMUNITY): Payer: Self-pay

## 2018-09-27 DIAGNOSIS — Z01812 Encounter for preprocedural laboratory examination: Secondary | ICD-10-CM | POA: Insufficient documentation

## 2018-09-27 DIAGNOSIS — N200 Calculus of kidney: Secondary | ICD-10-CM | POA: Insufficient documentation

## 2018-09-27 HISTORY — DX: Tinnitus, unspecified ear: H93.19

## 2018-09-27 HISTORY — DX: Pneumonia, unspecified organism: J18.9

## 2018-09-27 HISTORY — DX: Unspecified osteoarthritis, unspecified site: M19.90

## 2018-09-27 HISTORY — DX: Personal history of urinary calculi: Z87.442

## 2018-09-27 HISTORY — DX: Unspecified hearing loss, unspecified ear: H91.90

## 2018-09-27 HISTORY — DX: Anxiety disorder, unspecified: F41.9

## 2018-09-27 HISTORY — DX: Cardiomegaly: I51.7

## 2018-09-27 HISTORY — DX: Personal history of other diseases of the respiratory system: Z87.09

## 2018-09-27 LAB — CBC
HCT: 45.9 % (ref 39.0–52.0)
Hemoglobin: 14.4 g/dL (ref 13.0–17.0)
MCH: 26.9 pg (ref 26.0–34.0)
MCHC: 31.4 g/dL (ref 30.0–36.0)
MCV: 85.8 fL (ref 80.0–100.0)
Platelets: 353 10*3/uL (ref 150–400)
RBC: 5.35 MIL/uL (ref 4.22–5.81)
RDW: 13.2 % (ref 11.5–15.5)
WBC: 10.9 10*3/uL — ABNORMAL HIGH (ref 4.0–10.5)
nRBC: 0 % (ref 0.0–0.2)

## 2018-10-02 NOTE — Pre-Procedure Instructions (Signed)
[name] made aware of arrival to 5:30AM 10/04/2018 verbalized understanding.

## 2018-10-03 MED ORDER — CEFAZOLIN SODIUM 10 G IJ SOLR
3.0000 g | Freq: Once | INTRAMUSCULAR | Status: AC
  Administered 2018-10-04: 3 g via INTRAVENOUS
  Filled 2018-10-03: qty 3

## 2018-10-04 ENCOUNTER — Encounter (HOSPITAL_COMMUNITY): Payer: Self-pay

## 2018-10-04 ENCOUNTER — Ambulatory Visit (HOSPITAL_COMMUNITY): Payer: Managed Care, Other (non HMO) | Admitting: Certified Registered Nurse Anesthetist

## 2018-10-04 ENCOUNTER — Ambulatory Visit (HOSPITAL_COMMUNITY): Payer: Managed Care, Other (non HMO)

## 2018-10-04 ENCOUNTER — Ambulatory Visit (HOSPITAL_COMMUNITY)
Admission: RE | Admit: 2018-10-04 | Discharge: 2018-10-04 | Disposition: A | Discharge status: Home / Self Care | Payer: Managed Care, Other (non HMO) | Source: Ambulatory Visit | Attending: Urology | Admitting: Urology

## 2018-10-04 ENCOUNTER — Encounter (HOSPITAL_COMMUNITY)
Admission: RE | Disposition: A | Discharge status: Home / Self Care | Payer: Self-pay | Source: Ambulatory Visit | Attending: Urology

## 2018-10-04 DIAGNOSIS — Z87442 Personal history of urinary calculi: Secondary | ICD-10-CM | POA: Diagnosis present

## 2018-10-04 DIAGNOSIS — M19042 Primary osteoarthritis, left hand: Secondary | ICD-10-CM | POA: Insufficient documentation

## 2018-10-04 DIAGNOSIS — H9313 Tinnitus, bilateral: Secondary | ICD-10-CM | POA: Diagnosis not present

## 2018-10-04 DIAGNOSIS — I517 Cardiomegaly: Secondary | ICD-10-CM | POA: Insufficient documentation

## 2018-10-04 DIAGNOSIS — E669 Obesity, unspecified: Secondary | ICD-10-CM | POA: Insufficient documentation

## 2018-10-04 DIAGNOSIS — Z9109 Other allergy status, other than to drugs and biological substances: Secondary | ICD-10-CM | POA: Insufficient documentation

## 2018-10-04 DIAGNOSIS — M19041 Primary osteoarthritis, right hand: Secondary | ICD-10-CM | POA: Insufficient documentation

## 2018-10-04 DIAGNOSIS — F419 Anxiety disorder, unspecified: Secondary | ICD-10-CM | POA: Diagnosis not present

## 2018-10-04 DIAGNOSIS — K219 Gastro-esophageal reflux disease without esophagitis: Secondary | ICD-10-CM | POA: Diagnosis not present

## 2018-10-04 DIAGNOSIS — M479 Spondylosis, unspecified: Secondary | ICD-10-CM | POA: Diagnosis not present

## 2018-10-04 DIAGNOSIS — H9193 Unspecified hearing loss, bilateral: Secondary | ICD-10-CM | POA: Diagnosis not present

## 2018-10-04 DIAGNOSIS — Z6841 Body Mass Index (BMI) 40.0 and over, adult: Secondary | ICD-10-CM | POA: Diagnosis not present

## 2018-10-04 DIAGNOSIS — Z87891 Personal history of nicotine dependence: Secondary | ICD-10-CM | POA: Diagnosis not present

## 2018-10-04 DIAGNOSIS — F329 Major depressive disorder, single episode, unspecified: Secondary | ICD-10-CM | POA: Diagnosis not present

## 2018-10-04 DIAGNOSIS — J449 Chronic obstructive pulmonary disease, unspecified: Secondary | ICD-10-CM | POA: Diagnosis not present

## 2018-10-04 DIAGNOSIS — N200 Calculus of kidney: Secondary | ICD-10-CM | POA: Diagnosis not present

## 2018-10-04 HISTORY — PX: CYSTOSCOPY/URETEROSCOPY/HOLMIUM LASER/STENT PLACEMENT: SHX6546

## 2018-10-04 SURGERY — CYSTOSCOPY/URETEROSCOPY/HOLMIUM LASER/STENT PLACEMENT
Anesthesia: General | Site: Urethra | Laterality: Bilateral

## 2018-10-04 MED ORDER — KETOROLAC TROMETHAMINE 30 MG/ML IJ SOLN
INTRAMUSCULAR | Status: AC
  Filled 2018-10-04: qty 1

## 2018-10-04 MED ORDER — LACTATED RINGERS IV SOLN: INTRAVENOUS | Status: DC

## 2018-10-04 MED ORDER — 0.9 % SODIUM CHLORIDE (POUR BTL) OPTIME
TOPICAL | Status: DC | PRN
  Administered 2018-10-04: 1000 mL

## 2018-10-04 MED ORDER — KETOROLAC TROMETHAMINE 30 MG/ML IJ SOLN
INTRAMUSCULAR | Status: DC | PRN
  Administered 2018-10-04: 30 mg via INTRAVENOUS

## 2018-10-04 MED ORDER — PROPOFOL 10 MG/ML IV BOLUS
INTRAVENOUS | Status: DC | PRN
  Administered 2018-10-04: 300 mg via INTRAVENOUS

## 2018-10-04 MED ORDER — PHENAZOPYRIDINE HCL 200 MG PO TABS: 200.0000 mg | ORAL_TABLET | Freq: Three times a day (TID) | ORAL | 0 refills | Status: AC | PRN

## 2018-10-04 MED ORDER — OXYBUTYNIN CHLORIDE 5 MG PO TABS: 5.0000 mg | ORAL_TABLET | Freq: Three times a day (TID) | ORAL | 1 refills | Status: AC | PRN

## 2018-10-04 MED ORDER — ONDANSETRON HCL 4 MG/2ML IJ SOLN
INTRAMUSCULAR | Status: DC | PRN
  Administered 2018-10-04: 4 mg via INTRAVENOUS

## 2018-10-04 MED ORDER — FENTANYL CITRATE (PF) 100 MCG/2ML IJ SOLN: 25.0000 ug | INTRAMUSCULAR | Status: DC | PRN

## 2018-10-04 MED ORDER — LACTATED RINGERS IV SOLN
Freq: Once | INTRAVENOUS | Status: AC
  Administered 2018-10-04: 07:00:00 via INTRAVENOUS

## 2018-10-04 MED ORDER — MEPERIDINE HCL 50 MG/ML IJ SOLN: 6.2500 mg | INTRAMUSCULAR | Status: DC | PRN

## 2018-10-04 MED ORDER — ONDANSETRON HCL 4 MG PO TABS: 4.0000 mg | ORAL_TABLET | Freq: Every day | ORAL | 1 refills | Status: DC | PRN

## 2018-10-04 MED ORDER — LACTATED RINGERS IV SOLN
INTRAVENOUS | Status: DC | PRN
  Administered 2018-10-04: 07:00:00 via INTRAVENOUS

## 2018-10-04 MED ORDER — LIDOCAINE 2% (20 MG/ML) 5 ML SYRINGE
INTRAMUSCULAR | Status: AC
  Filled 2018-10-04: qty 5

## 2018-10-04 MED ORDER — FENTANYL CITRATE (PF) 100 MCG/2ML IJ SOLN
INTRAMUSCULAR | Status: DC | PRN
  Administered 2018-10-04 (×3): 50 ug via INTRAVENOUS

## 2018-10-04 MED ORDER — SODIUM CHLORIDE 0.9 % IR SOLN
Status: DC | PRN
  Administered 2018-10-04: 6000 mL

## 2018-10-04 MED ORDER — MIDAZOLAM HCL 2 MG/2ML IJ SOLN
INTRAMUSCULAR | Status: AC
  Filled 2018-10-04: qty 2

## 2018-10-04 MED ORDER — TAMSULOSIN HCL 0.4 MG PO CAPS: 0.4000 mg | ORAL_CAPSULE | Freq: Every day | ORAL | 0 refills | Status: DC

## 2018-10-04 MED ORDER — IOHEXOL 300 MG/ML  SOLN
INTRAMUSCULAR | Status: DC | PRN
  Administered 2018-10-04: 20 mL

## 2018-10-04 MED ORDER — PROPOFOL 10 MG/ML IV BOLUS
INTRAVENOUS | Status: AC
  Filled 2018-10-04: qty 40

## 2018-10-04 MED ORDER — MIDAZOLAM HCL 5 MG/5ML IJ SOLN
INTRAMUSCULAR | Status: DC | PRN
  Administered 2018-10-04 (×2): 1 mg via INTRAVENOUS

## 2018-10-04 MED ORDER — OXYCODONE-ACETAMINOPHEN 5-325 MG PO TABS: 1.0000 | ORAL_TABLET | Freq: Four times a day (QID) | ORAL | 0 refills | Status: AC | PRN

## 2018-10-04 MED ORDER — FENTANYL CITRATE (PF) 100 MCG/2ML IJ SOLN
INTRAMUSCULAR | Status: AC
  Filled 2018-10-04: qty 2

## 2018-10-04 MED ORDER — CEPHALEXIN 500 MG PO CAPS: 500.0000 mg | ORAL_CAPSULE | Freq: Two times a day (BID) | ORAL | 0 refills | Status: AC

## 2018-10-04 MED ORDER — LIDOCAINE 2% (20 MG/ML) 5 ML SYRINGE
INTRAMUSCULAR | Status: DC | PRN
  Administered 2018-10-04: 100 mg via INTRAVENOUS

## 2018-10-04 MED ORDER — ONDANSETRON HCL 4 MG/2ML IJ SOLN
INTRAMUSCULAR | Status: AC
  Filled 2018-10-04: qty 2

## 2018-10-04 MED ORDER — PROMETHAZINE HCL 25 MG/ML IJ SOLN: 6.2500 mg | INTRAMUSCULAR | Status: DC | PRN

## 2018-10-04 MED ORDER — DEXAMETHASONE SODIUM PHOSPHATE 10 MG/ML IJ SOLN
INTRAMUSCULAR | Status: DC | PRN
  Administered 2018-10-04: 10 mg via INTRAVENOUS

## 2018-10-04 SURGICAL SUPPLY — 21 items
BAG URO CATCHER STRL LF (MISCELLANEOUS) ×3 IMPLANT
BASKET ZERO TIP NITINOL 2.4FR (BASKET) IMPLANT
BSKT STON RTRVL ZERO TP 2.4FR (BASKET)
CATH URET 5FR 28IN OPEN ENDED (CATHETERS) ×2 IMPLANT
CLOTH BEACON ORANGE TIMEOUT ST (SAFETY) ×3 IMPLANT
COVER WAND RF STERILE (DRAPES) IMPLANT
EXTRACTOR STONE NITINOL NGAGE (UROLOGICAL SUPPLIES) IMPLANT
FIBER LASER FLEXIVA 365 (UROLOGICAL SUPPLIES) IMPLANT
FIBER LASER TRAC TIP (UROLOGICAL SUPPLIES) ×4 IMPLANT
GLOVE BIOGEL M STRL SZ7.5 (GLOVE) ×3 IMPLANT
GOWN STRL REUS W/TWL XL LVL3 (GOWN DISPOSABLE) ×3 IMPLANT
GUIDEWIRE STR DUAL SENSOR (WIRE) ×2 IMPLANT
GUIDEWIRE ZIPWRE .038 STRAIGHT (WIRE) ×3 IMPLANT
MANIFOLD NEPTUNE II (INSTRUMENTS) ×3 IMPLANT
PACK CYSTO (CUSTOM PROCEDURE TRAY) ×3 IMPLANT
SHEATH URETERAL 12FRX35CM (MISCELLANEOUS) IMPLANT
STENT CONTOUR 6FRX26X.038 (STENTS) IMPLANT
STENT URET 6FRX26 CONTOUR (STENTS) ×4 IMPLANT
TUBING CONNECTING 10 (TUBING) ×2 IMPLANT
TUBING CONNECTING 10' (TUBING) ×1
TUBING UROLOGY SET (TUBING) ×3 IMPLANT

## 2018-10-04 NOTE — H&P (Signed)
Urology Preoperative H&P   Chief Complaint: Back pain  History of Present Illness: Martin Bridges is a 42 y.o. male who was seen at an urgent care on 09/12/2018 secondary to acute lower back pain. He had a KUB at that time that showed bilateral renal stones with stones measuring approximately 4 mm and 1 cm in the left kidney and 1 cm in the right. He reports a 2 year history of dull, intermittent mid-lower back pain. His pain worsened 3 weeks ago after carrying a bag of cat litter. He takes ibuprofen, which partially alleviated his lower back pain. He denies any other specific modifying factors. He denies nausea/vomiting, fever/chills or flank pain today   No prior history of kidney stones.   From a urinary standpoint, he reports a good FOS and feels like he is emptying his bladder well. He has occasional urgency/frequency, but is not bothered by it. Nocturia x 1. Denies history of UTIs, dysuria or hematuria. UA clear.     Past Medical History:  Diagnosis Date  . Anxiety   . Arthritis    Spine, hands  . Asthma   . COPD (chronic obstructive pulmonary disease) (HCC)   . Depression   . Emphysema   . Hearing loss    Bilateral  . History of bronchitis   . History of kidney stones   . LVH (left ventricular hypertrophy) 2014   Mild, noted on ECHO  . Pneumonia 01/21/2012  . Tinnitus    Bilateral    Past Surgical History:  Procedure Laterality Date  . SHOULDER OPEN ROTATOR CUFF REPAIR  05/31/2012   Procedure: ROTATOR CUFF REPAIR SHOULDER OPEN;  Surgeon: Jacki Cones, MD;  Location: [facility];  Service: Orthopedics;  Laterality: Left;  with graft    Allergies:  Allergies  Allergen Reactions  . Other Itching    all metal    History reviewed. No pertinent family history.  Social History:  reports that he quit smoking about 19 years ago. His smoking use included cigars. He has quit using smokeless tobacco.  His smokeless tobacco use included snuff and chew. He reports that he  drinks alcohol. He reports that he does not use drugs.  ROS: A complete review of systems was performed.  All systems are negative except for pertinent findings as noted.  Physical Exam:  Vital signs in last 24 hours: Temp:  [98.4 F (36.9 C)] 98.4 F (36.9 C) (10/23 0545) Pulse Rate:  [90] 90 (10/23 0545) Resp:  [18] 18 (10/23 0545) BP: (119)/(73) 119/73 (10/23 0545) SpO2:  [99 %] 99 % (10/23 0545) Weight:  [181.6 kg] 181.6 kg (10/23 0545) Constitutional:  Alert and oriented, No acute distress Cardiovascular: Regular rate and rhythm, No JVD Respiratory: Normal respiratory effort, Lungs clear bilaterally GI: Abdomen is soft, nontender, nondistended, no abdominal masses GU: No CVA tenderness Lymphatic: No lymphadenopathy Neurologic: Grossly intact, no focal deficits Psychiatric: Normal mood and affect  Laboratory Data:  No results for input(s): WBC, HGB, HCT, PLT in the last 72 hours.  No results for input(s): NA, K, CL, GLUCOSE, BUN, CALCIUM, CREATININE in the last 72 hours.  Invalid input(s): CO3   No results found for this or any previous visit (from the past 24 hour(s)). No results found for this or any previous visit (from the past 240 hour(s)).  Renal Function: No results for input(s): CREATININE in the last 168 hours. CrCl cannot be calculated (Patient's most recent lab result is older than the maximum 21 days allowed.).  Radiologic Imaging: No results found.  I independently reviewed the above imaging studies.  Assessment and Plan Martin Bridges is a 42 y.o. male with bilateral renal stones and on-going back pain   The risks, benefits and alternatives of cystoscopy with BILATERAL ureteroscopy, laser lithotripsy and ureteral stent placement was discussed the patient.  Risks included, but are not limited to: bleeding, urinary tract infection, ureteral injury/avulsion, ureteral stricture formation, retained stone fragments, the possibility that multiple  surgeries may be required to treat the stone(s), MI, stroke, PE and the inherent risks of general anesthesia.  The patient voices understanding and wishes to proceed.      Rhoderick Moody, MD 10/04/2018, 6:01 AM  [facility] Pager: [phone]

## 2018-10-04 NOTE — Anesthesia Preprocedure Evaluation (Addendum)
Anesthesia Evaluation  Patient identified by MRN, date of birth, ID band Patient awake    Reviewed: Allergy & Precautions, NPO status , Patient's Chart, lab work & pertinent test results  Airway Mallampati: I  TM Distance: >3 FB Neck ROM: Full    Dental  (+) Missing, Dental Advisory Given,    Pulmonary asthma , sleep apnea , COPD,  COPD inhaler, former smoker,     + decreased breath sounds      Cardiovascular negative cardio ROS   Rhythm:Regular Rate:Normal     Neuro/Psych Anxiety Depression    GI/Hepatic Neg liver ROS, GERD  ,  Endo/Other  negative endocrine ROS  Renal/GU negative Renal ROS     Musculoskeletal  (+) Arthritis ,   Abdominal (+) + obese,   Peds  Hematology negative hematology ROS (+)   Anesthesia Other Findings   Reproductive/Obstetrics                           Anesthesia Physical Anesthesia Plan  ASA: III  Anesthesia Plan: General   Post-op Pain Management:    Induction: Intravenous  PONV Risk Score and Plan: 3 and Ondansetron, Dexamethasone and Midazolam  Airway Management Planned: LMA  Additional Equipment: None  Intra-op Plan:   Post-operative Plan: Extubation in OR  Informed Consent: I have reviewed the patients History and Physical, chart, labs and discussed the procedure including the risks, benefits and alternatives for the proposed anesthesia with the patient or authorized representative who has indicated his/her understanding and acceptance.   Dental advisory given  Plan Discussed with: CRNA  Anesthesia Plan Comments:        Anesthesia Quick Evaluation

## 2018-10-04 NOTE — Anesthesia Procedure Notes (Signed)
Procedure Name: LMA Insertion Date/Time: 10/04/2018 7:39 AM Performed by: Epimenio Sarin, CRNA Pre-anesthesia Checklist: Patient identified, Emergency Drugs available, Suction available, Patient being monitored and Timeout performed Patient Re-evaluated:Patient Re-evaluated prior to induction Oxygen Delivery Method: Circle system utilized Preoxygenation: Pre-oxygenation with 100% oxygen Induction Type: IV induction LMA: LMA with gastric port inserted LMA Size: 5.0 Number of attempts: 1 Dental Injury: Teeth and Oropharynx as per pre-operative assessment

## 2018-10-04 NOTE — Transfer of Care (Signed)
Immediate Anesthesia Transfer of Care Note  Patient: Martin Bridges  Procedure(s) Performed: CYSTOSCOPY/RETROGRADE/URETEROSCOPY/HOLMIUM LASER/STENT PLACEMENT (Bilateral Urethra)  Patient Location: PACU  Anesthesia Type:General  Level of Consciousness: drowsy and patient cooperative  Airway & Oxygen Therapy: Patient Spontanous Breathing and Patient connected to face mask oxygen  Post-op Assessment: Report given to RN and Post -op Vital signs reviewed and stable  Post vital signs: Reviewed and stable  Last Vitals:  Vitals Value Taken Time  BP    Temp    Pulse 90 10/04/2018  9:16 AM  Resp 16 10/04/2018  9:16 AM  SpO2 100 % 10/04/2018  9:16 AM  Vitals shown include unvalidated device data.  Last Pain:  Vitals:   10/04/18 0554  TempSrc:   PainSc: 6       Patients Stated Pain Goal: 4 (10/04/18 0554)  Complications: No apparent anesthesia complications

## 2018-10-04 NOTE — Anesthesia Postprocedure Evaluation (Signed)
Anesthesia Post Note  Patient: TALBERT TREMBATH  Procedure(s) Performed: CYSTOSCOPY/RETROGRADE/URETEROSCOPY/HOLMIUM LASER/STENT PLACEMENT (Bilateral Urethra)     Patient location during evaluation: PACU Anesthesia Type: General Level of consciousness: awake and alert Pain management: pain level controlled Vital Signs Assessment: post-procedure vital signs reviewed and stable Respiratory status: spontaneous breathing, nonlabored ventilation, respiratory function stable and patient connected to nasal cannula oxygen Cardiovascular status: blood pressure returned to baseline and stable Postop Assessment: no apparent nausea or vomiting Anesthetic complications: no    Last Vitals:  Vitals:   10/04/18 0958 10/04/18 1016  BP: (!) 149/94 (!) 142/94  Pulse: 75 74  Resp: 17 16  Temp: 36.5 C 36.4 C  SpO2: 94% 99%    Last Pain:  Vitals:   10/04/18 0945  TempSrc:   PainSc: 0-No pain                 Shelton Silvas

## 2018-10-04 NOTE — Op Note (Signed)
Operative Note  Preoperative diagnosis:  1.  Bilateral renal stones  Postoperative diagnosis: 1.  Same  Procedure(s): 1.  Cystoscopy with bilateral flexible ureteroscopy, holmium laser lithotripsy and bilateral JJ stent placement 2.  Bilateral retrograde pyelograms with intraoperative interpretation of fluoroscopic imaging  Surgeon: Rhoderick Moody, MD  Assistants:  None  Anesthesia:  General  Complications:  None  EBL: Less than 5 mL  Specimens: 1.  None  Drains/Catheters: 1.  Bilateral 6 [place] by 26 cm JJ stents without tether  Intraoperative findings:   1. 1 cm right renal wall is a 1 cm and 4 mm left renal stones 2. Solitary right collecting system with no filling defects or dilation involving the right ureter or right renal pelvis seen on retrograde pyelogram 3. Solitary left collecting system with no filling defects or dilation involving the left ureter or left renal pelvis seen on retrograde pyelogram  Indication:  Martin Bridges is a 42 y.o. male with ongoing back pain and CT evidence of bilateral nonobstructing renal stones.  He has been consented for the above procedures, voices understanding and wishes to proceed.  Description of procedure:  After informed consent was obtained, the patient was brought to the operating room and general LMA anesthesia was administered. The patient was then placed in the dorsolithotomy position and prepped and draped in usual sterile fashion. A timeout was performed. A 23 French rigid cystoscope was then inserted into the urethral meatus and advanced into the bladder under direct vision. A complete bladder survey revealed no intravesical pathology.  A 5 French ureteral catheter was then inserted into the right ureteral orifice and a right retrograde pyelogram was obtained, with the findings listed above.  A Glidewire was then advanced through the ureteral catheter and up to the right renal pelvis, under fluoroscopic guidance.   The rigid cystoscope was removed and a flexible ureteroscope was advanced over the wire and into position within the right renal pelvis.  A mid calyx stone measuring approximately 1 cm was identified.  A 200 m holmium laser was then used to dust the stone.  The flexible ureteroscope was then removed and reinspection of the ureter revealed no evidence of obstructing stones or trauma.  A 6 French by 26 cm JJ stent was then placed over the wire and into good position within the right collecting system.  A 5 French ureteral catheter was then inserted into the left ureteral orifice and a left retrograde pyelogram was obtained, with the findings listed above.  A Glidewire was then advanced into the left renal pelvis in a similar fashion.  The flexible ureteroscope was then advanced over the wire and into position within the left renal pelvis, confirming placement via fluoroscopy.  Spectrum of the left renal pelvis and its associated calyces revealed 1 cm lower pole and 4 mm upper pole stones.  A 200 m holmium laser was then used to dust the stones in a similar fashion.  The flexible ureteroscope was then removed under direct vision, identifying no obstructing stones or ureteral trauma.  A 6 French by 26 cm JJ stent was then placed over the wire and into good position within the left collecting system, confirming placement via fluoroscopy.  The patient's bladder was drained.  He tolerated the procedure well and was transferred to the postanesthesia in stable condition.  Plan: Follow-up in 1 week for office cystoscopy and stent removal.  RUS in 6 weeks.

## 2018-10-05 ENCOUNTER — Encounter (HOSPITAL_COMMUNITY): Payer: Self-pay | Admitting: Urology

## 2018-10-13 ENCOUNTER — Emergency Department (HOSPITAL_COMMUNITY): Payer: Self-pay

## 2018-10-13 ENCOUNTER — Encounter (HOSPITAL_COMMUNITY): Payer: Self-pay | Admitting: Emergency Medicine

## 2018-10-13 ENCOUNTER — Other Ambulatory Visit: Payer: Self-pay

## 2018-10-13 ENCOUNTER — Emergency Department (HOSPITAL_COMMUNITY)
Admission: EM | Admit: 2018-10-13 | Discharge: 2018-10-13 | Disposition: A | Discharge status: Home / Self Care | Payer: Self-pay | Attending: Emergency Medicine | Admitting: Emergency Medicine

## 2018-10-13 DIAGNOSIS — J449 Chronic obstructive pulmonary disease, unspecified: Secondary | ICD-10-CM | POA: Insufficient documentation

## 2018-10-13 DIAGNOSIS — Z79899 Other long term (current) drug therapy: Secondary | ICD-10-CM | POA: Insufficient documentation

## 2018-10-13 DIAGNOSIS — Z87891 Personal history of nicotine dependence: Secondary | ICD-10-CM | POA: Insufficient documentation

## 2018-10-13 DIAGNOSIS — N200 Calculus of kidney: Secondary | ICD-10-CM

## 2018-10-13 DIAGNOSIS — N1330 Unspecified hydronephrosis: Secondary | ICD-10-CM | POA: Insufficient documentation

## 2018-10-13 DIAGNOSIS — N202 Calculus of kidney with calculus of ureter: Secondary | ICD-10-CM | POA: Insufficient documentation

## 2018-10-13 LAB — BASIC METABOLIC PANEL
ANION GAP: 8 (ref 5–15)
BUN: 16 mg/dL (ref 6–20)
CALCIUM: 9.3 mg/dL (ref 8.9–10.3)
CO2: 28 mmol/L (ref 22–32)
Chloride: 99 mmol/L (ref 98–111)
Creatinine, Ser: 1.66 mg/dL — ABNORMAL HIGH (ref 0.61–1.24)
GFR calc Af Amer: 58 mL/min — ABNORMAL LOW (ref >60)
GFR, EST NON AFRICAN AMERICAN: 50 mL/min — AB (ref >60)
GLUCOSE: 155 mg/dL — AB (ref 70–99)
POTASSIUM: 5.1 mmol/L (ref 3.5–5.1)
Sodium: 135 mmol/L (ref 135–145)

## 2018-10-13 LAB — CBC WITH DIFFERENTIAL/PLATELET
ABS IMMATURE GRANULOCYTES: 0.03 10*3/uL (ref 0.00–0.07)
BASOS PCT: 0 %
Basophils Absolute: 0 10*3/uL (ref 0.0–0.1)
EOS ABS: 0.1 10*3/uL (ref 0.0–0.5)
Eosinophils Relative: 1 %
HCT: 43.3 % (ref 39.0–52.0)
Hemoglobin: 13.7 g/dL (ref 13.0–17.0)
IMMATURE GRANULOCYTES: 0 %
Lymphocytes Relative: 10 %
Lymphs Abs: 1.2 10*3/uL (ref 0.7–4.0)
MCH: 27 pg (ref 26.0–34.0)
MCHC: 31.6 g/dL (ref 30.0–36.0)
MCV: 85.4 fL (ref 80.0–100.0)
MONOS PCT: 6 %
Monocytes Absolute: 0.7 10*3/uL (ref 0.1–1.0)
NEUTROS ABS: 10 10*3/uL — AB (ref 1.7–7.7)
NRBC: 0 % (ref 0.0–0.2)
Neutrophils Relative %: 83 %
Platelets: 295 10*3/uL (ref 150–400)
RBC: 5.07 MIL/uL (ref 4.22–5.81)
RDW: 13.4 % (ref 11.5–15.5)
WBC: 12.1 10*3/uL — ABNORMAL HIGH (ref 4.0–10.5)

## 2018-10-13 LAB — URINALYSIS, ROUTINE W REFLEX MICROSCOPIC
BILIRUBIN URINE: NEGATIVE
Glucose, UA: NEGATIVE mg/dL
Ketones, ur: NEGATIVE mg/dL
Leukocytes, UA: NEGATIVE
NITRITE: POSITIVE — AB
PH: 6 (ref 5.0–8.0)
Protein, ur: 100 mg/dL — AB
RBC / HPF: >50 RBC/hpf — ABNORMAL HIGH (ref 0–5)
Specific Gravity, Urine: 1.021 (ref 1.005–1.030)
WBC, UA: >50 WBC/hpf — ABNORMAL HIGH (ref 0–5)

## 2018-10-13 MED ORDER — SODIUM CHLORIDE 0.9 % IV SOLN
1.0000 g | Freq: Once | INTRAVENOUS | Status: AC
  Administered 2018-10-13: 1 g via INTRAVENOUS
  Filled 2018-10-13: qty 10

## 2018-10-13 MED ORDER — CEPHALEXIN 500 MG PO CAPS: 500.0000 mg | ORAL_CAPSULE | Freq: Three times a day (TID) | ORAL | 0 refills | Status: DC

## 2018-10-13 MED ORDER — SODIUM CHLORIDE 0.9 % IV BOLUS (SEPSIS)
1000.0000 mL | Freq: Once | INTRAVENOUS | Status: AC
  Administered 2018-10-13: 1000 mL via INTRAVENOUS

## 2018-10-13 MED ORDER — OXYCODONE-ACETAMINOPHEN 5-325 MG PO TABS: 2.0000 | ORAL_TABLET | Freq: Four times a day (QID) | ORAL | 0 refills | Status: AC | PRN

## 2018-10-13 MED ORDER — HYDROMORPHONE HCL 1 MG/ML IJ SOLN
1.0000 mg | Freq: Once | INTRAMUSCULAR | Status: AC
  Administered 2018-10-13: 1 mg via INTRAVENOUS
  Filled 2018-10-13: qty 1

## 2018-10-13 MED ORDER — ONDANSETRON HCL 4 MG/2ML IJ SOLN
4.0000 mg | Freq: Once | INTRAMUSCULAR | Status: AC
  Administered 2018-10-13: 4 mg via INTRAVENOUS
  Filled 2018-10-13: qty 2

## 2018-10-13 MED ORDER — TAMSULOSIN HCL 0.4 MG PO CAPS: 0.4000 mg | ORAL_CAPSULE | Freq: Every day | ORAL | 0 refills | Status: AC

## 2018-10-13 NOTE — Discharge Instructions (Signed)
Continue your pain and nausea medication as prescribed.

## 2018-10-13 NOTE — ED Triage Notes (Signed)
Pt from home with c/o left flank pain. Pt states he had stents placed on 10/23 and removed 10/31. Pt states after they were removed he began having left flank pain. Pt currently rates pain 9/10. Pt also reports difficulty urinating.

## 2018-10-13 NOTE — ED Provider Notes (Addendum)
TIME SEEN: 1:59 AM  CHIEF COMPLAINT: Left flank pain  HPI: Patient is a 42 year old male with history of COPD, kidney stones, morbid obesity who presents to the emergency department with left flank pain.  States that he has history of kidney stones and had lithotripsy and bilateral stents placed on October 23.  Had these stents removed in the office yesterday by his urologist.  States around 9 PM he started having severe left-sided flank pain that has increased over time.  Took 2 Percocet at home without any relief.  No fever, nausea or vomiting.  No abdominal pain.  No diarrhea.  ROS: See HPI Constitutional: no fever  Eyes: no drainage  ENT: no runny nose   Cardiovascular:  no chest pain  Resp: no SOB  GI: no vomiting GU: no dysuria Integumentary: no rash  Allergy: no hives  Musculoskeletal: no leg swelling  Neurological: no slurred speech ROS otherwise negative  PAST MEDICAL HISTORY/PAST SURGICAL HISTORY:  Past Medical History:  Diagnosis Date  . Anxiety   . Arthritis    Spine, hands  . Asthma   . COPD (chronic obstructive pulmonary disease) (HCC)   . Depression   . Emphysema   . Hearing loss    Bilateral  . History of bronchitis   . History of kidney stones   . LVH (left ventricular hypertrophy) 2014   Mild, noted on ECHO  . Pneumonia 01/21/2012  . Tinnitus    Bilateral    MEDICATIONS:  Prior to Admission medications   Medication Sig Start Date End Date Taking? Authorizing Provider  albuterol (PROVENTIL HFA;VENTOLIN HFA) 108 (90 BASE) MCG/ACT inhaler Inhale 2 puffs into the lungs every 6 (six) hours as needed for wheezing or shortness of breath.     [provider]  albuterol (PROVENTIL) (2.5 MG/3ML) 0.083% nebulizer solution Take 2.5 mg by nebulization every 6 (six) hours as needed for wheezing or shortness of breath.     [provider]  etodolac (LODINE) 500 MG tablet Take 1 tablet (500 mg total) by mouth 2 (two) times daily. Patient not taking:  Reported on 12/08/2014 03/17/14   Autumn Messing H, PA-C  ondansetron (ZOFRAN) 4 MG tablet Take 1 tablet (4 mg total) by mouth daily as needed for nausea or vomiting. 10/04/18 10/04/19  Rene Paci, MD  oxybutynin (DITROPAN) 5 MG tablet Take 1 tablet (5 mg total) by mouth every 8 (eight) hours as needed for bladder spasms. 10/04/18   Rene Paci, MD  oxyCODONE-acetaminophen (PERCOCET/ROXICET) 5-325 MG tablet Take 1-2 tablets by mouth every 6 (six) hours as needed for severe pain. 10/04/18   Rene Paci, MD  phenazopyridine (PYRIDIUM) 200 MG tablet Take 1 tablet (200 mg total) by mouth 3 (three) times daily as needed (for pain with urination). 10/04/18 10/04/19  Rene Paci, MD  tamsulosin (FLOMAX) 0.4 MG CAPS capsule Take 1 capsule (0.4 mg total) by mouth daily. 10/04/18   Rene Paci, MD    ALLERGIES:  Allergies  Allergen Reactions  . Other Itching    all metal    SOCIAL HISTORY:  Social History   Tobacco Use  . Smoking status: Former Smoker    Types: Cigars    Last attempt to quit: 12/13/1998    Years since quitting: 19.8  . Smokeless tobacco: Former Neurosurgeon    Types: Snuff, Chew  Substance Use Topics  . Alcohol use: Yes    Comment: rare    FAMILY HISTORY: No family history on file.  EXAM: BP 135/86   Pulse 88   Temp 97.9 F (36.6 C) (Oral)   Resp 16   Ht 6\' 3"  (1.905 m)   Wt (!) 181.4 kg   SpO2 99%   BMI 50.00 kg/m  CONSTITUTIONAL: Alert and oriented and responds appropriately to questions.  Morbidly obese, afebrile, appears uncomfortable HEAD: Normocephalic EYES: Conjunctivae clear, pupils appear equal, EOMI ENT: normal nose; moist mucous membranes NECK: Supple, no meningismus, no nuchal rigidity, no LAD  CARD: RRR; S1 and S2 appreciated; no murmurs, no clicks, no rubs, no gallops RESP: Normal chest excursion without splinting or tachypnea; breath sounds clear and equal bilaterally; no wheezes, no  rhonchi, no rales, no hypoxia or respiratory distress, speaking full sentences ABD/GI: Normal bowel sounds; non-distended; soft, non-tender, no rebound, no guarding, no peritoneal signs, no hepatosplenomegaly BACK:  The back appears normal and is non-tender to palpation, there is no CVA tenderness EXT: Normal ROM in all joints; non-tender to palpation; no edema; normal capillary refill; no cyanosis, no calf tenderness or swelling    SKIN: Normal color for age and race; warm; no rash NEURO: Moves all extremities equally PSYCH: The patient's mood and manner are appropriate. Grooming and personal hygiene are appropriate.  MEDICAL DECISION MAKING: Patient here with left-sided flank pain.  Will obtain labs, urine a CT scan.  Differential includes pyelonephritis, normal postoperative pain, kidney stone.  Abdominal exam benign.  No injury to the back.  Will give Dilaudid for pain control.  ED PROGRESS: Patient's pain has been well controlled with IV Dilaudid.  He states pain is slightly coming back now.  Will give second dose.  He does have a mild leukocytosis of 12,000 with left shift but no fever.  Urine is nitrite positive with large amount of red blood cells, white blood cells and few bacteria.  Urine culture has been sent.  Given dose of Rocephin here in the emergency department.  It is difficult to tell if this is true infection given he just had stents removed.  CT scan shows 2 adjacent stones at the left UPJ with mild hydronephrosis.  The largest stone is approximately 5 mm.  This is likely from his recent lithotripsy.  Given his pain is well controlled I feel he is safe to be discharged with outpatient follow-up.  He will call his urologist in the morning.  Patient and wife are comfortable with this plan.  He has Percocet and Zofran at home.  6:40 AM  D/w Dr. Ronne Binning with urology who agrees with this plan. Recommends discharging with Flomax.   At this time, I do not feel there is any  life-threatening condition present. I have reviewed and discussed all results (EKG, imaging, lab, urine as appropriate) and exam findings with patient/family. I have reviewed nursing notes and appropriate previous records.  I feel the patient is safe to be discharged home without further emergent workup and can continue workup as an outpatient as needed. Discussed usual and customary return precautions. Patient/family verbalize understanding and are comfortable with this plan.  Outpatient follow-up has been provided if needed. All questions have been answered.        Antonio Woodhams, Layla Maw, DO 10/13/18 [phone]

## 2018-10-14 LAB — URINE CULTURE: Culture: NO GROWTH

## 2019-08-05 ENCOUNTER — Emergency Department
Admission: EM | Admit: 2019-08-05 | Discharge: 2019-08-05 | Disposition: A | Discharge status: Home / Self Care | Payer: Self-pay | Attending: Emergency Medicine | Admitting: Emergency Medicine

## 2019-08-05 ENCOUNTER — Other Ambulatory Visit: Payer: Self-pay

## 2019-08-05 DIAGNOSIS — B958 Unspecified staphylococcus as the cause of diseases classified elsewhere: Secondary | ICD-10-CM | POA: Insufficient documentation

## 2019-08-05 DIAGNOSIS — J449 Chronic obstructive pulmonary disease, unspecified: Secondary | ICD-10-CM | POA: Insufficient documentation

## 2019-08-05 DIAGNOSIS — Z79899 Other long term (current) drug therapy: Secondary | ICD-10-CM | POA: Insufficient documentation

## 2019-08-05 DIAGNOSIS — R21 Rash and other nonspecific skin eruption: Secondary | ICD-10-CM | POA: Insufficient documentation

## 2019-08-05 DIAGNOSIS — Z87891 Personal history of nicotine dependence: Secondary | ICD-10-CM | POA: Insufficient documentation

## 2019-08-05 MED ORDER — CEPHALEXIN 500 MG PO CAPS: 500.0000 mg | ORAL_CAPSULE | Freq: Three times a day (TID) | ORAL | 0 refills | Status: AC

## 2019-08-05 MED ORDER — SULFAMETHOXAZOLE-TRIMETHOPRIM 800-160 MG PO TABS: 1.0000 | ORAL_TABLET | Freq: Two times a day (BID) | ORAL | 0 refills | Status: AC

## 2019-08-05 NOTE — ED Provider Notes (Signed)
[facility] Emergency Department Provider Note  ____________________________________________  Time seen: Approximately 9:20 PM  I have reviewed the triage vital signs and the nursing notes.   HISTORY  Chief Complaint Rash    HPI Martin Bridges is a 43 y.o. male presents to the emergency department with erythematous rash diffusely of the upper extremities and abdomen.  Patient also has a vesicle formation along a tattoo.  Patient reports tattoo is old and he has not recently had worked into it.  Patient states that his wife had "a little bump" that spread but her rash resolved.  Patient denies fever or chills at home.  He denies history of prior MRSA infection.  No known tick bites.  No new exposures that patient can think of.  No other alleviating measures have been attempted.        Past Medical History:  Diagnosis Date  . Anxiety   . Arthritis    Spine, hands  . Asthma   . COPD (chronic obstructive pulmonary disease) ([place])   . Depression   . Emphysema   . Hearing loss    Bilateral  . History of bronchitis   . History of kidney stones   . LVH (left ventricular hypertrophy) 2014   Mild, noted on ECHO  . Pneumonia 01/21/2012  . Tinnitus    Bilateral    Patient Active Problem List   Diagnosis Date Noted  . Chest pain 03/08/2013  . COPD (chronic obstructive pulmonary disease) ([place])   . Depression   . GERD (gastroesophageal reflux disease)   . Sleep apnea   . Rotator cuff tear arthropathy 05/31/2012    Past Surgical History:  Procedure Laterality Date  . CYSTOSCOPY/URETEROSCOPY/HOLMIUM LASER/STENT PLACEMENT Bilateral 10/04/2018   Procedure: CYSTOSCOPY/RETROGRADE/URETEROSCOPY/HOLMIUM LASER/STENT PLACEMENT;  Surgeon: Ceasar Mons, MD;  Location: [facility];  Service: Urology;  Laterality: Bilateral;  . SHOULDER OPEN ROTATOR CUFF REPAIR  05/31/2012   Procedure: ROTATOR CUFF REPAIR SHOULDER OPEN;  Surgeon: Tobi Bastos, MD;   Location: [facility];  Service: Orthopedics;  Laterality: Left;  with graft    Prior to Admission medications   Medication Sig Start Date End Date Taking? Authorizing Provider  albuterol (PROVENTIL HFA;VENTOLIN HFA) 108 (90 BASE) MCG/ACT inhaler Inhale 2 puffs into the lungs every 6 (six) hours as needed for wheezing or shortness of breath.     [provider]  albuterol (PROVENTIL) (2.5 MG/3ML) 0.083% nebulizer solution Take 2.5 mg by nebulization every 6 (six) hours as needed for wheezing or shortness of breath.     [provider]  cephALEXin (KEFLEX) 500 MG capsule Take 1 capsule (500 mg total) by mouth 3 (three) times daily for 7 days. 08/05/19 08/12/19  Lannie Fields, PA-C  oxybutynin (DITROPAN) 5 MG tablet Take 1 tablet (5 mg total) by mouth every 8 (eight) hours as needed for bladder spasms. 10/04/18   Ceasar Mons, MD  oxyCODONE-acetaminophen (PERCOCET/ROXICET) 5-325 MG tablet Take 1-2 tablets by mouth every 6 (six) hours as needed for severe pain. 10/04/18   Ceasar Mons, MD  oxyCODONE-acetaminophen (PERCOCET/ROXICET) 5-325 MG tablet Take 2 tablets by mouth every 6 (six) hours as needed. 10/13/18   Ward, Delice Bison, DO  phenazopyridine (PYRIDIUM) 200 MG tablet Take 1 tablet (200 mg total) by mouth 3 (three) times daily as needed (for pain with urination). 10/04/18 10/04/19  Ceasar Mons, MD  sulfamethoxazole-trimethoprim (BACTRIM DS) 800-160 MG tablet Take 1 tablet by mouth 2 (two) times daily for 7  days. 08/05/19 08/12/19  Orvil FeilWoods, Jaclyn M, PA-C  tamsulosin (FLOMAX) 0.4 MG CAPS capsule Take 1 capsule (0.4 mg total) by mouth daily. 10/13/18   Ward, Layla MawKristen N, DO    Allergies Other  No family history on file.  Social History Social History   Tobacco Use  . Smoking status: Former Smoker    Types: Cigars    Quit date: 12/13/1998    Years since quitting: 20.6  . Smokeless tobacco: Former NeurosurgeonUser    Types: Snuff, Chew  Substance Use Topics   . Alcohol use: Yes    Comment: rare  . Drug use: No     Review of Systems  Constitutional: No fever/chills Eyes: No visual changes. No discharge ENT: No upper respiratory complaints. Cardiovascular: no chest pain. Respiratory: no cough. No SOB. Gastrointestinal: No abdominal pain.  No nausea, no vomiting.  No diarrhea.  No constipation. Genitourinary: Negative for dysuria. No hematuria Musculoskeletal: Negative for musculoskeletal pain. Skin: Patient has rash. Neurological: Negative for headaches, focal weakness or numbness.   ____________________________________________   PHYSICAL EXAM:  VITAL SIGNS: ED Triage Vitals  Enc Vitals Group     BP 08/05/19 2007 (!) 148/95     Pulse Rate 08/05/19 2007 95     Resp 08/05/19 2007 18     Temp 08/05/19 2007 98.4 F (36.9 C)     Temp Source 08/05/19 2007 Oral     SpO2 08/05/19 2007 99 %     Weight 08/05/19 2008 (!) 390 lb (176.9 kg)     Height 08/05/19 2008 6\' 3"  (1.905 m)     Head Circumference --      Peak Flow --      Pain Score 08/05/19 2008 0     Pain Loc --      Pain Edu? --      Excl. in GC? --      Constitutional: Alert and oriented. Well appearing and in no acute distress. Eyes: Conjunctivae are normal. PERRL. EOMI. Head: Atraumatic. ENT:      Nose: No congestion/rhinnorhea.      Mouth/Throat: Mucous membranes are moist.  Neck: No stridor.  No cervical spine tenderness to palpation. Cardiovascular: Normal rate, regular rhythm. Normal S1 and S2.  Good peripheral circulation. Respiratory: Normal respiratory effort without tachypnea or retractions. Lungs CTAB. Good air entry to the bases with no decreased or absent breath sounds. Gastrointestinal: Bowel sounds 4 quadrants. Soft and nontender to palpation. No guarding or rigidity. No palpable masses. No distention. No CVA tenderness. Musculoskeletal: Full range of motion to all extremities. No gross deformities appreciated. Neurologic:  Normal speech and language.  No gross focal neurologic deficits are appreciated.  Skin: Patient has erythematous rash of upper extremities and abdomen.  Patient has a vesicle formation around tattoo of right upper extremity. Psychiatric: Mood and affect are normal. Speech and behavior are normal. Patient exhibits appropriate insight and judgement.   ____________________________________________   LABS (all labs ordered are listed, but only abnormal results are displayed)  Labs Reviewed - No data to display ____________________________________________  EKG   ____________________________________________  RADIOLOGY  No results found.  ____________________________________________    PROCEDURES  Procedure(s) performed:    Procedures    Medications - No data to display   ____________________________________________   INITIAL IMPRESSION / ASSESSMENT AND PLAN / ED COURSE  Pertinent labs & imaging results that were available during my care of the patient were reviewed by me and considered in my medical decision making (see chart for  details).  Review of the [place] CSRS was performed in accordance of the [facility] prior to dispensing any controlled drugs.         Assessment and plan Staph infection 43 year old male presents to the emergency department with an erythematous rash of the upper extremities and abdomen.  Patient was hypertensive at triage but vital signs were otherwise reassuring.  Rash is consistent with staph infection.  Patient was treated with both Bactrim and Keflex.  He was advised to follow-up with primary care as needed.  Return precautions were given.  All patient questions were answered.   ____________________________________________  FINAL CLINICAL IMPRESSION(S) / ED DIAGNOSES  Final diagnoses:  Staph infection      NEW MEDICATIONS STARTED DURING THIS VISIT:  ED Discharge Orders         Ordered    cephALEXin (KEFLEX) 500 MG capsule  3 times daily     08/05/19 2118     sulfamethoxazole-trimethoprim (BACTRIM DS) 800-160 MG tablet  2 times daily     08/05/19 2118              This chart was dictated using voice recognition software/Dragon. Despite best efforts to proofread, errors can occur which can change the meaning. Any change was purely unintentional.    Gasper LloydWoods, Jaclyn M, PA-C 08/05/19 2123    Dionne BucySiadecki, Sebastian, MD 08/05/19 [phone]

## 2019-08-05 NOTE — ED Notes (Signed)
Pt verbalized understanding of discharge instructions. NAD at this time. 

## 2019-08-05 NOTE — ED Triage Notes (Signed)
Two weeks of raised itchy rash that has progressively gotten worse. States it "feels like reptilian skin" and "has a fever in it." Red raised rash noted over entire body. Legs are not as affected but still present.

## 2019-08-08 ENCOUNTER — Encounter (HOSPITAL_COMMUNITY): Payer: Self-pay

## 2019-08-08 ENCOUNTER — Emergency Department (HOSPITAL_COMMUNITY)
Admission: EM | Admit: 2019-08-08 | Discharge: 2019-08-08 | Disposition: A | Discharge status: Home / Self Care | Payer: Self-pay | Attending: Emergency Medicine | Admitting: Emergency Medicine

## 2019-08-08 ENCOUNTER — Other Ambulatory Visit: Payer: Self-pay

## 2019-08-08 DIAGNOSIS — R21 Rash and other nonspecific skin eruption: Secondary | ICD-10-CM | POA: Insufficient documentation

## 2019-08-08 DIAGNOSIS — Z87891 Personal history of nicotine dependence: Secondary | ICD-10-CM | POA: Insufficient documentation

## 2019-08-08 DIAGNOSIS — Z79899 Other long term (current) drug therapy: Secondary | ICD-10-CM | POA: Insufficient documentation

## 2019-08-08 DIAGNOSIS — J449 Chronic obstructive pulmonary disease, unspecified: Secondary | ICD-10-CM | POA: Insufficient documentation

## 2019-08-08 MED ORDER — DIPHENHYDRAMINE-ZINC ACETATE 2-0.1 % EX CREA: 1.0000 "application " | TOPICAL_CREAM | Freq: Three times a day (TID) | CUTANEOUS | 0 refills | Status: AC | PRN

## 2019-08-08 MED ORDER — LORATADINE 10 MG PO TABS
10.0000 mg | ORAL_TABLET | Freq: Once | ORAL | Status: AC
  Administered 2019-08-08: 10 mg via ORAL
  Filled 2019-08-08: qty 1

## 2019-08-08 MED ORDER — DIPHENHYDRAMINE HCL 25 MG PO TABS: 25.0000 mg | ORAL_TABLET | Freq: Four times a day (QID) | ORAL | 0 refills | Status: AC | PRN

## 2019-08-08 MED ORDER — PREDNISONE 20 MG PO TABS
60.0000 mg | ORAL_TABLET | Freq: Once | ORAL | Status: AC
  Administered 2019-08-08: 18:00:00 60 mg via ORAL
  Filled 2019-08-08: qty 3

## 2019-08-08 MED ORDER — PERMETHRIN 5 % EX CREA: TOPICAL_CREAM | CUTANEOUS | 0 refills | Status: AC

## 2019-08-08 MED ORDER — FAMOTIDINE 20 MG PO TABS
20.0000 mg | ORAL_TABLET | Freq: Once | ORAL | Status: AC
  Administered 2019-08-08: 18:00:00 20 mg via ORAL
  Filled 2019-08-08: qty 1

## 2019-08-08 MED ORDER — FAMOTIDINE 20 MG PO TABS: 20.0000 mg | ORAL_TABLET | Freq: Two times a day (BID) | ORAL | 0 refills | Status: AC

## 2019-08-08 NOTE — ED Notes (Addendum)
Pt verbalized discharge instructions and follow up care. Alert and ambulatory. No IV. No further questions at this time.  

## 2019-08-08 NOTE — ED Provider Notes (Signed)
[facility]   CSN: [id number] Arrival date & time: 08/08/19  1640     History   Chief Complaint Chief Complaint  Patient presents with  . Rash    HPI Martin Bridges is a 43 y.o. male.     HPI  Patient is a 43 year old male with a history of anxiety/depression, COPD, kidney stones, who presents to the emergency department today for evaluation of a rash.  States he has had a rash for the last 3 to 4 days.  Rashes pruritic and located to the bilateral upper and lower extremities.  Also located to the trunk.  He denies any fevers or chills.  He denies any other systemic symptoms such as angioedema or shortness of breath.  He denies any new soaps, detergents, medications, foods or changes in his environment.   He does note that he has chickens and pigs at home.  He notes that 1 of his chickens has "bumble foot" which is caused by Staphylococcus.  He also noted that his pig had a rash and he was handling this animal.  He was seen at [facility] [place] [facility] 3 days ago and started on Keflex and Bactrim.  He has been compliant with this medication but states that the rash is worsening.  He denies any history of allergies to either of these medications.  Past Medical History:  Diagnosis Date  . Anxiety   . Arthritis    Spine, hands  . Asthma   . COPD (chronic obstructive pulmonary disease) ([facility])   . Depression   . Emphysema   . Hearing loss    Bilateral  . History of bronchitis   . History of kidney stones   . LVH (left ventricular hypertrophy) 2014   Mild, noted on ECHO  . Pneumonia 01/21/2012  . Tinnitus    Bilateral    Patient Active Problem List   Diagnosis Date Noted  . Chest pain 03/08/2013  . COPD (chronic obstructive pulmonary disease) ([facility])   . Depression   . GERD (gastroesophageal reflux disease)   . Sleep apnea   . Rotator cuff tear arthropathy 05/31/2012    Past Surgical History:  Procedure Laterality Date  .  CYSTOSCOPY/URETEROSCOPY/HOLMIUM LASER/STENT PLACEMENT Bilateral 10/04/2018   Procedure: CYSTOSCOPY/RETROGRADE/URETEROSCOPY/HOLMIUM LASER/STENT PLACEMENT;  Surgeon: Rene PaciWinter, Christopher Aaron, MD;  Location: [facility];  Service: Urology;  Laterality: Bilateral;  . SHOULDER OPEN ROTATOR CUFF REPAIR  05/31/2012   Procedure: ROTATOR CUFF REPAIR SHOULDER OPEN;  Surgeon: Jacki Conesonald A Gioffre, MD;  Location: [facility];  Service: Orthopedics;  Laterality: Left;  with graft        Home Medications    Prior to Admission medications   Medication Sig Start Date End Date Taking? Authorizing Provider  albuterol (PROVENTIL HFA;VENTOLIN HFA) 108 (90 BASE) MCG/ACT inhaler Inhale 2 puffs into the lungs every 6 (six) hours as needed for wheezing or shortness of breath.     [provider]  albuterol (PROVENTIL) (2.5 MG/3ML) 0.083% nebulizer solution Take 2.5 mg by nebulization every 6 (six) hours as needed for wheezing or shortness of breath.     [provider]  cephALEXin (KEFLEX) 500 MG capsule Take 1 capsule (500 mg total) by mouth 3 (three) times daily for 7 days. 08/05/19 08/12/19  Orvil FeilWoods, Jaclyn M, PA-C  diphenhydrAMINE (BENADRYL) 25 MG tablet Take 1 tablet (25 mg total) by mouth every 6 (six) hours as needed. 08/08/19   Delayna Sparlin S, PA-C  diphenhydrAMINE-zinc acetate (BENADRYL EXTRA STRENGTH) cream  Apply 1 application topically 3 (three) times daily as needed for itching. 08/08/19   Nello Corro S, PA-C  famotidine (PEPCID) 20 MG tablet Take 1 tablet (20 mg total) by mouth 2 (two) times daily for 5 days. 08/08/19 08/13/19  Danford Tat S, PA-C  oxybutynin (DITROPAN) 5 MG tablet Take 1 tablet (5 mg total) by mouth every 8 (eight) hours as needed for bladder spasms. 10/04/18   Ceasar Mons, MD  oxyCODONE-acetaminophen (PERCOCET/ROXICET) 5-325 MG tablet Take 1-2 tablets by mouth every 6 (six) hours as needed for severe pain. 10/04/18   Ceasar Mons, MD   oxyCODONE-acetaminophen (PERCOCET/ROXICET) 5-325 MG tablet Take 2 tablets by mouth every 6 (six) hours as needed. 10/13/18   Ward, Delice Bison, DO  permethrin (ELIMITE) 5 % cream Apply to affected area once 08/08/19   Rocio Wolak S, PA-C  phenazopyridine (PYRIDIUM) 200 MG tablet Take 1 tablet (200 mg total) by mouth 3 (three) times daily as needed (for pain with urination). 10/04/18 10/04/19  Ceasar Mons, MD  sulfamethoxazole-trimethoprim (BACTRIM DS) 800-160 MG tablet Take 1 tablet by mouth 2 (two) times daily for 7 days. 08/05/19 08/12/19  Lannie Fields, PA-C  tamsulosin (FLOMAX) 0.4 MG CAPS capsule Take 1 capsule (0.4 mg total) by mouth daily. 10/13/18   Ward, Delice Bison, DO    Family History No family history on file.  Social History Social History   Tobacco Use  . Smoking status: Former Smoker    Types: Cigars    Quit date: 12/13/1998    Years since quitting: 20.6  . Smokeless tobacco: Former Systems developer    Types: Snuff, Chew  Substance Use Topics  . Alcohol use: Yes    Comment: rare  . Drug use: No     Allergies   Other   Review of Systems Review of Systems  Constitutional: Negative for fever.  HENT: Negative for ear pain and sore throat.   Eyes: Negative for visual disturbance.  Respiratory: Negative for cough and shortness of breath.   Cardiovascular: Negative for chest pain.  Gastrointestinal: Negative for abdominal pain, constipation, diarrhea, nausea and vomiting.  Genitourinary: Negative for dysuria and hematuria.  Musculoskeletal: Negative for back pain.  Skin: Positive for color change and rash.  Neurological: Negative for headaches.  All other systems reviewed and are negative.    Physical Exam Updated Vital Signs BP 128/82   Pulse (!) 102   Temp 99.6 F (37.6 C) (Oral)   Resp 16   Wt (!) 177 kg   SpO2 100%   BMI 48.77 kg/m   Physical Exam Vitals signs and nursing note reviewed.  Constitutional:      Appearance: He is well-developed.   HENT:     Head: Normocephalic and atraumatic.  Eyes:     Conjunctiva/sclera: Conjunctivae normal.  Neck:     Musculoskeletal: Neck supple.  Cardiovascular:     Rate and Rhythm: Normal rate and regular rhythm.     Heart sounds: No murmur.  Pulmonary:     Effort: Pulmonary effort is normal. No respiratory distress.     Breath sounds: Normal breath sounds. No wheezing, rhonchi or rales.  Abdominal:     General: Bowel sounds are normal.     Palpations: Abdomen is soft.     Tenderness: There is no abdominal tenderness.  Skin:    General: Skin is warm and dry.     Comments: Erythematous papular rash located to the bilat upper/lower extremities and trunk. No evidence of  superinfection. Rash pictured below.   Neurological:     Mental Status: He is alert.          ED Treatments / Results  Labs (all labs ordered are listed, but only abnormal results are displayed) Labs Reviewed - No data to display  EKG None  Radiology No results found.  Procedures Procedures (including critical care time)  Medications Ordered in ED Medications  famotidine (PEPCID) tablet 20 mg (20 mg Oral Given 08/08/19 1807)  loratadine (CLARITIN) tablet 10 mg (10 mg Oral Given 08/08/19 1807)  predniSONE (DELTASONE) tablet 60 mg (60 mg Oral Given 08/08/19 1807)     Initial Impression / Assessment and Plan / ED Course  I have reviewed the triage vital signs and the nursing notes.  Pertinent labs & imaging results that were available during my care of the patient were reviewed by me and considered in my medical decision making (see chart for details).     Final Clinical Impressions(s) / ED Diagnoses   Final diagnoses:  Rash   Rash present for 3 days after handling animals at his home. Has been on keflex and bactrim but continues to have itchiness and persistent rash. Rash may represent scabies versus exposure to flees. Will have him continue his abx. Will give permethrin and advised hygenic  precautions at home. Will give pepcid and benadryl as well for itching. Patient denies any difficulty breathing or swallowing.  Pt has a patent airway without stridor and is handling secretions without difficulty; no angioedema. No large blisters, no pustules, no warmth, no draining sinus tracts, no superficial abscesses, no bullous impetigo, no vesicles, no desquamation, no target lesions with dusky purpura or a central bulla. Not tender to touch. No concern for superimposed infection. No concern for SJS, TEN, TSS, tick borne illness, syphilis or other life-threatening condition. Advised patient to f/u closely with either pcp or in the ED. Will give him info for dermatology as well. Advised to RTER immediately for new or worsening symptoms.    ED Discharge Orders         Ordered    permethrin (ELIMITE) 5 % cream     08/08/19 1813    diphenhydrAMINE (BENADRYL) 25 MG tablet  Every 6 hours PRN     08/08/19 1921    famotidine (PEPCID) 20 MG tablet  2 times daily     08/08/19 1921    diphenhydrAMINE-zinc acetate (BENADRYL EXTRA STRENGTH) cream  3 times daily PRN     08/08/19 1921           Rayne Du 08/08/19 Dorene Sorrow, MD 08/09/19 1520

## 2019-08-08 NOTE — ED Triage Notes (Addendum)
Pt states that he was dx with staph infection on 8/23. Pt states that he was given keflex and bactrim, and it has gotten worse. Pt describes itching and burning with blisters. Pt states that it is now all over- abd, arms, neck, legs, etc.

## 2019-08-08 NOTE — Discharge Instructions (Signed)
Please continue taking your antibiotics.  You were also given permethrin to help treat the rash.  Please use as directed.  For symptomatic treatment, you were given Benadryl, Benadryl cream and Pepcid.  Please take as directed.  Do not drive, go to work or operate machinery while taking Benadryl as it will make you very sleepy.  Please monitor your symptoms closely.  If symptoms worsen please return to the emergency department immediately.  Please also return for any fevers, swelling of your mouth, difficulty breathing, spread of the rash to your hands or mouth.

## 2021-07-27 ENCOUNTER — Other Ambulatory Visit: Payer: Self-pay

## 2021-07-27 ENCOUNTER — Emergency Department (HOSPITAL_COMMUNITY): Payer: Self-pay

## 2021-07-27 ENCOUNTER — Emergency Department (HOSPITAL_COMMUNITY)
Admission: EM | Admit: 2021-07-27 | Discharge: 2021-07-27 | Disposition: A | Discharge status: Home / Self Care | Payer: Self-pay | Attending: Student | Admitting: Student

## 2021-07-27 ENCOUNTER — Encounter (HOSPITAL_COMMUNITY): Payer: Self-pay

## 2021-07-27 DIAGNOSIS — J449 Chronic obstructive pulmonary disease, unspecified: Secondary | ICD-10-CM | POA: Insufficient documentation

## 2021-07-27 DIAGNOSIS — R197 Diarrhea, unspecified: Secondary | ICD-10-CM | POA: Insufficient documentation

## 2021-07-27 DIAGNOSIS — M791 Myalgia, unspecified site: Secondary | ICD-10-CM | POA: Insufficient documentation

## 2021-07-27 DIAGNOSIS — J45909 Unspecified asthma, uncomplicated: Secondary | ICD-10-CM | POA: Insufficient documentation

## 2021-07-27 DIAGNOSIS — J029 Acute pharyngitis, unspecified: Secondary | ICD-10-CM | POA: Insufficient documentation

## 2021-07-27 DIAGNOSIS — R509 Fever, unspecified: Secondary | ICD-10-CM | POA: Insufficient documentation

## 2021-07-27 DIAGNOSIS — R059 Cough, unspecified: Secondary | ICD-10-CM | POA: Insufficient documentation

## 2021-07-27 DIAGNOSIS — R519 Headache, unspecified: Secondary | ICD-10-CM | POA: Insufficient documentation

## 2021-07-27 DIAGNOSIS — Z20822 Contact with and (suspected) exposure to covid-19: Secondary | ICD-10-CM | POA: Insufficient documentation

## 2021-07-27 DIAGNOSIS — R0981 Nasal congestion: Secondary | ICD-10-CM | POA: Insufficient documentation

## 2021-07-27 DIAGNOSIS — Z28310 Unvaccinated for covid-19: Secondary | ICD-10-CM | POA: Insufficient documentation

## 2021-07-27 DIAGNOSIS — Z87891 Personal history of nicotine dependence: Secondary | ICD-10-CM | POA: Insufficient documentation

## 2021-07-27 LAB — POC SARS CORONAVIRUS 2 AG -  ED: SARSCOV2ONAVIRUS 2 AG: NEGATIVE

## 2021-07-27 MED ORDER — LIDOCAINE VISCOUS HCL 2 % MT SOLN
15.0000 mL | Freq: Once | OROMUCOSAL | Status: AC
  Administered 2021-07-27: 15 mL via ORAL
  Filled 2021-07-27: qty 15

## 2021-07-27 MED ORDER — ALUM & MAG HYDROXIDE-SIMETH 200-200-20 MG/5ML PO SUSP
30.0000 mL | Freq: Once | ORAL | Status: AC
  Administered 2021-07-27: 30 mL via ORAL
  Filled 2021-07-27: qty 30

## 2021-07-27 MED ORDER — BENZONATATE 100 MG PO CAPS: 100.0000 mg | ORAL_CAPSULE | Freq: Three times a day (TID) | ORAL | 0 refills | Status: AC

## 2021-07-27 MED ORDER — LIDOCAINE VISCOUS HCL 2 % MT SOLN
15.0000 mL | Freq: Once | OROMUCOSAL | Status: AC
  Administered 2021-07-27: 15 mL via OROMUCOSAL
  Filled 2021-07-27: qty 15

## 2021-07-27 MED ORDER — ONDANSETRON HCL 4 MG PO TABS: 4.0000 mg | ORAL_TABLET | Freq: Four times a day (QID) | ORAL | 0 refills | Status: AC

## 2021-07-27 MED ORDER — ACETAMINOPHEN 500 MG PO TABS
1000.0000 mg | ORAL_TABLET | Freq: Once | ORAL | Status: AC
  Administered 2021-07-27: 1000 mg via ORAL
  Filled 2021-07-27: qty 2

## 2021-07-27 NOTE — ED Provider Notes (Signed)
[facility]-EMERGENCY DEPT Provider Note   CSN: [id number] Arrival date & time: 07/27/21  0946     History Chief Complaint  Patient presents with   Generalized Body Aches   Sore Throat   Cough    Martin QUEBEDEAUX is a 45 y.o. male.   Sore Throat Associated symptoms include headaches. Pertinent negatives include no chest pain and no shortness of breath.  Cough Associated symptoms: fever, headaches, myalgias and sore throat   Associated symptoms: no chest pain and no shortness of breath    Patient presents with 10 days of sore throat, headache, body aches, fever, diarrhea.  His wife is sick at home as well.  Patient is not COVID vaccinated.  He has tried Tylenol with minimal relief.  Denies any known aggravating factors.  Past Medical History:  Diagnosis Date   Anxiety    Arthritis    Spine, hands   Asthma    COPD (chronic obstructive pulmonary disease) (HCC)    Depression    Emphysema    Hearing loss    Bilateral   History of bronchitis    History of kidney stones    LVH (left ventricular hypertrophy) 2014   Mild, noted on ECHO   Pneumonia 01/21/2012   Tinnitus    Bilateral    Patient Active Problem List   Diagnosis Date Noted   Chest pain 03/08/2013   COPD (chronic obstructive pulmonary disease) (HCC)    Depression    GERD (gastroesophageal reflux disease)    Sleep apnea    Rotator cuff tear arthropathy 05/31/2012    Past Surgical History:  Procedure Laterality Date   CYSTOSCOPY/URETEROSCOPY/HOLMIUM LASER/STENT PLACEMENT Bilateral 10/04/2018   Procedure: CYSTOSCOPY/RETROGRADE/URETEROSCOPY/HOLMIUM LASER/STENT PLACEMENT;  Surgeon: Rene Paci, MD;  Location: [facility];  Service: Urology;  Laterality: Bilateral;   SHOULDER OPEN ROTATOR CUFF REPAIR  05/31/2012   Procedure: ROTATOR CUFF REPAIR SHOULDER OPEN;  Surgeon: Jacki Cones, MD;  Location: [facility];  Service: Orthopedics;  Laterality: Left;  with graft        History reviewed. No pertinent family history.  Social History   Tobacco Use   Smoking status: Former    Types: Cigars    Quit date: 12/13/1998    Years since quitting: 22.6   Smokeless tobacco: Former    Types: Snuff, Chew  Vaping Use   Vaping Use: Some days  Substance Use Topics   Alcohol use: Yes    Comment: rare   Drug use: No    Home Medications Prior to Admission medications   Medication Sig Start Date End Date Taking? Authorizing Provider  albuterol (PROVENTIL HFA;VENTOLIN HFA) 108 (90 BASE) MCG/ACT inhaler Inhale 2 puffs into the lungs every 6 (six) hours as needed for wheezing or shortness of breath.     [provider]  albuterol (PROVENTIL) (2.5 MG/3ML) 0.083% nebulizer solution Take 2.5 mg by nebulization every 6 (six) hours as needed for wheezing or shortness of breath.     [provider]  diphenhydrAMINE (BENADRYL) 25 MG tablet Take 1 tablet (25 mg total) by mouth every 6 (six) hours as needed. 08/08/19   Couture, Cortni S, PA-C  diphenhydrAMINE-zinc acetate (BENADRYL EXTRA STRENGTH) cream Apply 1 application topically 3 (three) times daily as needed for itching. 08/08/19   Couture, Cortni S, PA-C  famotidine (PEPCID) 20 MG tablet Take 1 tablet (20 mg total) by mouth 2 (two) times daily for 5 days. 08/08/19 08/13/19  Couture, Cortni S, PA-C  oxybutynin (DITROPAN)  5 MG tablet Take 1 tablet (5 mg total) by mouth every 8 (eight) hours as needed for bladder spasms. 10/04/18   Rene Paci, MD  oxyCODONE-acetaminophen (PERCOCET/ROXICET) 5-325 MG tablet Take 1-2 tablets by mouth every 6 (six) hours as needed for severe pain. 10/04/18   Rene Paci, MD  oxyCODONE-acetaminophen (PERCOCET/ROXICET) 5-325 MG tablet Take 2 tablets by mouth every 6 (six) hours as needed. 10/13/18   Ward, Layla Maw, DO  permethrin (ELIMITE) 5 % cream Apply to affected area once 08/08/19   Couture, Cortni S, PA-C  tamsulosin (FLOMAX) 0.4 MG CAPS capsule  Take 1 capsule (0.4 mg total) by mouth daily. 10/13/18   Ward, Layla Maw, DO    Allergies    Other  Review of Systems   Review of Systems  Constitutional:  Positive for fatigue and fever.  HENT:  Positive for congestion and sore throat.   Respiratory:  Positive for cough. Negative for shortness of breath.   Cardiovascular:  Negative for chest pain.  Gastrointestinal:  Positive for diarrhea.  Musculoskeletal:  Positive for myalgias.  Neurological:  Positive for headaches.   Physical Exam Updated Vital Signs BP (!) 166/99   Pulse (!) 104   Temp 98.8 F (37.1 C)   Resp 18   Ht 6\' 3"  (1.905 m)   Wt (!) 177 kg   SpO2 100%   BMI 48.77 kg/m   Physical Exam Vitals and nursing note reviewed. Exam conducted with a chaperone present.  Constitutional:      General: He is not in acute distress.    Appearance: Normal appearance.  HENT:     Head: Normocephalic and atraumatic.     Right Ear: Tympanic membrane and ear canal normal.     Left Ear: Tympanic membrane and ear canal normal.     Nose: Congestion present.     Mouth/Throat:     Pharynx: Uvula midline. Posterior oropharyngeal erythema present.  Eyes:     General: No scleral icterus.    Extraocular Movements: Extraocular movements intact.     Pupils: Pupils are equal, round, and reactive to light.  Cardiovascular:     Rate and Rhythm: Normal rate and regular rhythm.  Pulmonary:     Effort: Pulmonary effort is normal.     Breath sounds: Normal breath sounds.  Skin:    Coloration: Skin is not jaundiced.  Neurological:     Mental Status: He is alert. Mental status is at baseline.     Coordination: Coordination normal.    ED Results / Procedures / Treatments   Labs (all labs ordered are listed, but only abnormal results are displayed) Labs Reviewed  POC SARS CORONAVIRUS 2 AG -  ED  POC SARS CORONAVIRUS 2 AG -  ED    EKG None  Radiology DG Chest Port 1 View  Result Date: 07/27/2021 CLINICAL DATA:  Productive  cough, weakness, shortness of breath EXAM: PORTABLE CHEST 1 VIEW COMPARISON:  Chest radiograph 08/04/2010 FINDINGS: The heart is at the upper limits of normal for size. The mediastinal contours are within normal limits. There is no focal consolidation or pulmonary edema. There is no pleural effusion or pneumothorax. There is no acute osseous abnormality. IMPRESSION: Borderline cardiomegaly. Otherwise, no radiographic evidence of acute cardiopulmonary process. Electronically Signed   By: 08/06/2010 M.D.   On: 07/27/2021 10:49    Procedures Procedures   Medications Ordered in ED Medications  acetaminophen (TYLENOL) tablet 1,000 mg (has no administration in time range)  alum &  mag hydroxide-simeth (MAALOX/MYLANTA) 200-200-20 MG/5ML suspension 30 mL (has no administration in time range)    And  lidocaine (XYLOCAINE) 2 % viscous mouth solution 15 mL (has no administration in time range)    ED Course  I have reviewed the triage vital signs and the nursing notes.  Pertinent labs & imaging results that were available during my care of the patient were reviewed by me and considered in my medical decision making (see chart for details).    MDM Rules/Calculators/A&P                           Patient vitals are stable, he is nontoxic-appearing.  Chest x-ray without any findings of pneumonia.  His physical exam is consistent with a viral pharyngitis.  He also could have a URI.  Do not suspect any emergent pathology, he is satting at 99% on room air during his ED stay.  He is afebrile without tachycardia.  COVID test negative, he is appropriate for discharge with symptomatic management at this time.  Final Clinical Impression(s) / ED Diagnoses Final diagnoses:  None    Rx / DC Orders ED Discharge Orders     None        Theron Arista, Cordelia Poche 07/27/21 1227    Glendora Score, MD 07/27/21 1622

## 2021-07-27 NOTE — Discharge Instructions (Addendum)
You can swish the viscous lidocaine in your mouth daily for sore throat.  There is a numbing medicine that will help your throat feel better.  You can also take over-the-counter cough drops and drink warm tea. Take Tessalon Perles every 8 hours as needed for coughing. May use the ER Dantron/Zofran tablets every 6 hours as needed for nausea and vomiting.  Continue to stay hydrated and drink lots of fluids throughout the day.  Take Tylenol as needed for body aches and fever.  He can also take Motrin if that works better.

## 2021-07-27 NOTE — ED Triage Notes (Signed)
Pt reports productive cough, weakness, shob, body aches, sore throat, fatigue x 1 week. Pt reports wife is sick also

## 2023-03-21 IMAGING — DX DG CHEST 1V PORT
2 series · 2 of 2 positions shown · non-contrast
Comparison: Chest radiograph 08/04/2010

CLINICAL DATA: Productive cough, weakness, shortness of breath

EXAM:
PORTABLE CHEST 1 VIEW

[chest ap (1 of 2)]
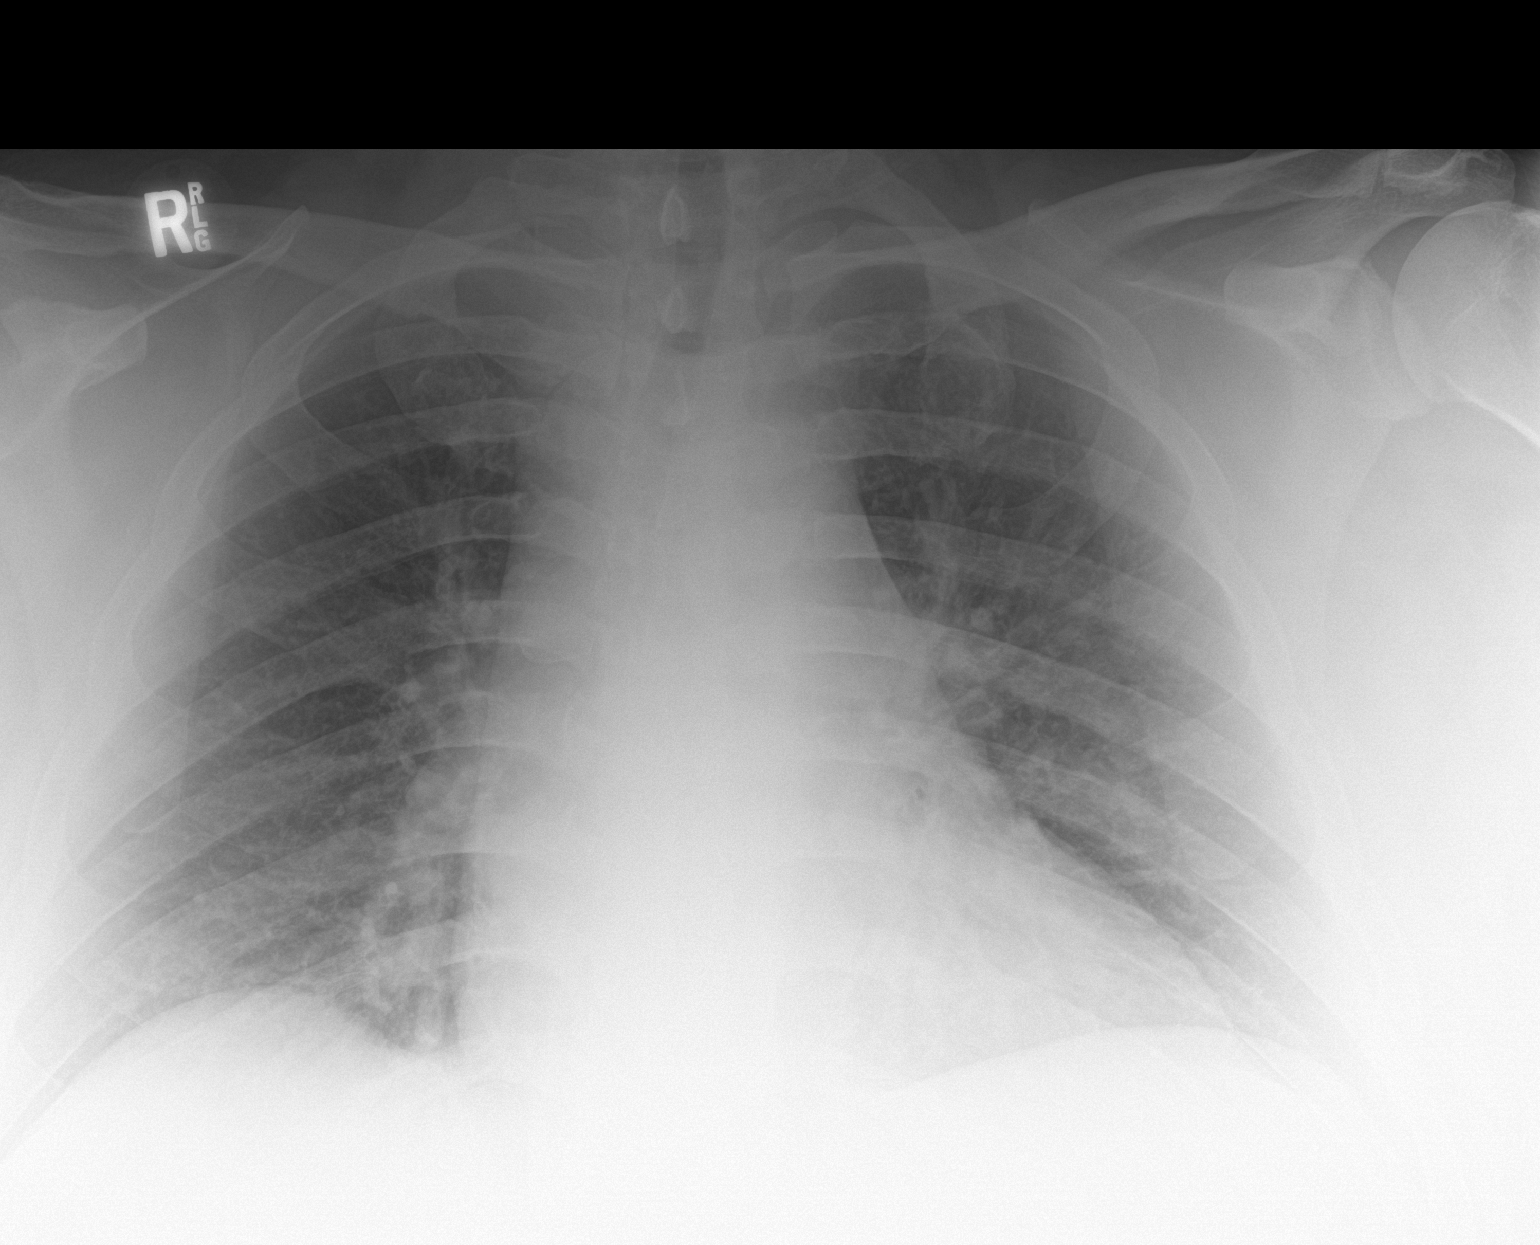

[chest ap (2 of 2)]
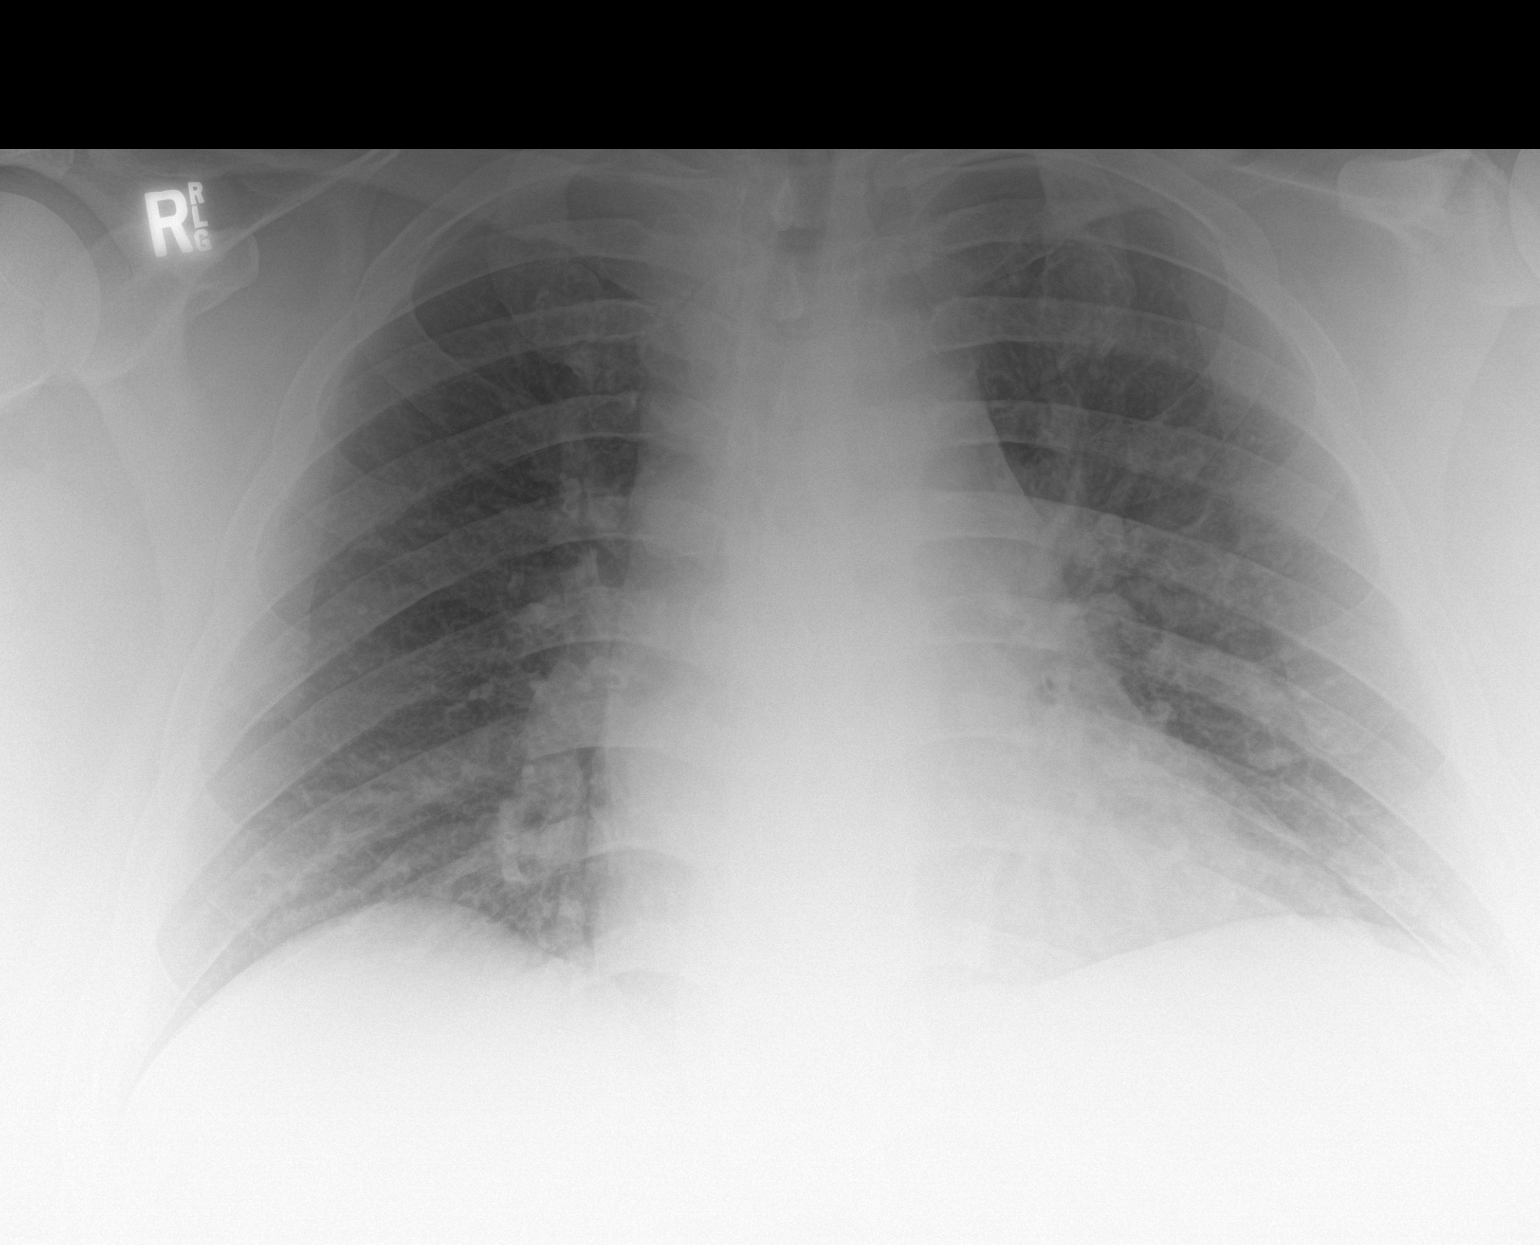

[2 of 2 positions shown; findings below may reference images not displayed]

FINDINGS: The heart is at the upper limits of normal for size. The mediastinal
contours are within normal limits.

There is no focal consolidation or pulmonary edema. There is no
pleural effusion or pneumothorax.

There is no acute osseous abnormality.
IMPRESSION: Borderline cardiomegaly. Otherwise, no radiographic evidence of
acute cardiopulmonary process.
